# Patient Record
Sex: Male | Born: 1945
Health system: Southern US, Community
[De-identification: ages and names within clinical notes are randomized; demographics above are authoritative.]

## PROBLEM LIST (undated history)

## (undated) DIAGNOSIS — I219 Acute myocardial infarction, unspecified: Secondary | ICD-10-CM

## (undated) DIAGNOSIS — Z972 Presence of dental prosthetic device (complete) (partial): Secondary | ICD-10-CM

## (undated) DIAGNOSIS — I2510 Atherosclerotic heart disease of native coronary artery without angina pectoris: Secondary | ICD-10-CM

## (undated) DIAGNOSIS — M545 Low back pain, unspecified: Secondary | ICD-10-CM

## (undated) DIAGNOSIS — K5190 Ulcerative colitis, unspecified, without complications: Secondary | ICD-10-CM

## (undated) DIAGNOSIS — Z789 Other specified health status: Secondary | ICD-10-CM

## (undated) DIAGNOSIS — M549 Dorsalgia, unspecified: Secondary | ICD-10-CM

## (undated) DIAGNOSIS — K219 Gastro-esophageal reflux disease without esophagitis: Secondary | ICD-10-CM

## (undated) DIAGNOSIS — I10 Essential (primary) hypertension: Secondary | ICD-10-CM

## (undated) DIAGNOSIS — Z87442 Personal history of urinary calculi: Secondary | ICD-10-CM

## (undated) DIAGNOSIS — M1990 Unspecified osteoarthritis, unspecified site: Secondary | ICD-10-CM

## (undated) DIAGNOSIS — Z973 Presence of spectacles and contact lenses: Secondary | ICD-10-CM

## (undated) DIAGNOSIS — G8929 Other chronic pain: Secondary | ICD-10-CM

## (undated) HISTORY — PX: HERNIA REPAIR: SHX51

## (undated) HISTORY — PX: CORONARY ANGIOPLASTY: SHX604

## (undated) HISTORY — PX: COLON SURGERY: SHX602

## (undated) HISTORY — PX: BACK SURGERY: SHX140

## (undated) HISTORY — PX: APPENDECTOMY: SHX54

## (undated) HISTORY — PX: NOSE SURGERY: SHX723

---

## 2007-05-02 ENCOUNTER — Emergency Department (HOSPITAL_COMMUNITY): Admission: EM | Admit: 2007-05-02 | Discharge: 2007-05-02 | Payer: Self-pay | Admitting: Emergency Medicine

## 2007-08-29 ENCOUNTER — Ambulatory Visit (HOSPITAL_COMMUNITY): Admission: RE | Admit: 2007-08-29 | Discharge: 2007-08-30 | Payer: Self-pay | Admitting: Neurological Surgery

## 2007-08-29 IMAGING — CR DG LUMBAR SPINE 2-3V
1 series · 1 of 1 positions shown · non-contrast
Comparison: None

CLINICAL DATA: Right L3-4 microdiskectomy, herniated nucleus pulposus.
 LUMBAR SPINE INTRAOPERATIVE ? 2 VIEW:

[view not recorded]
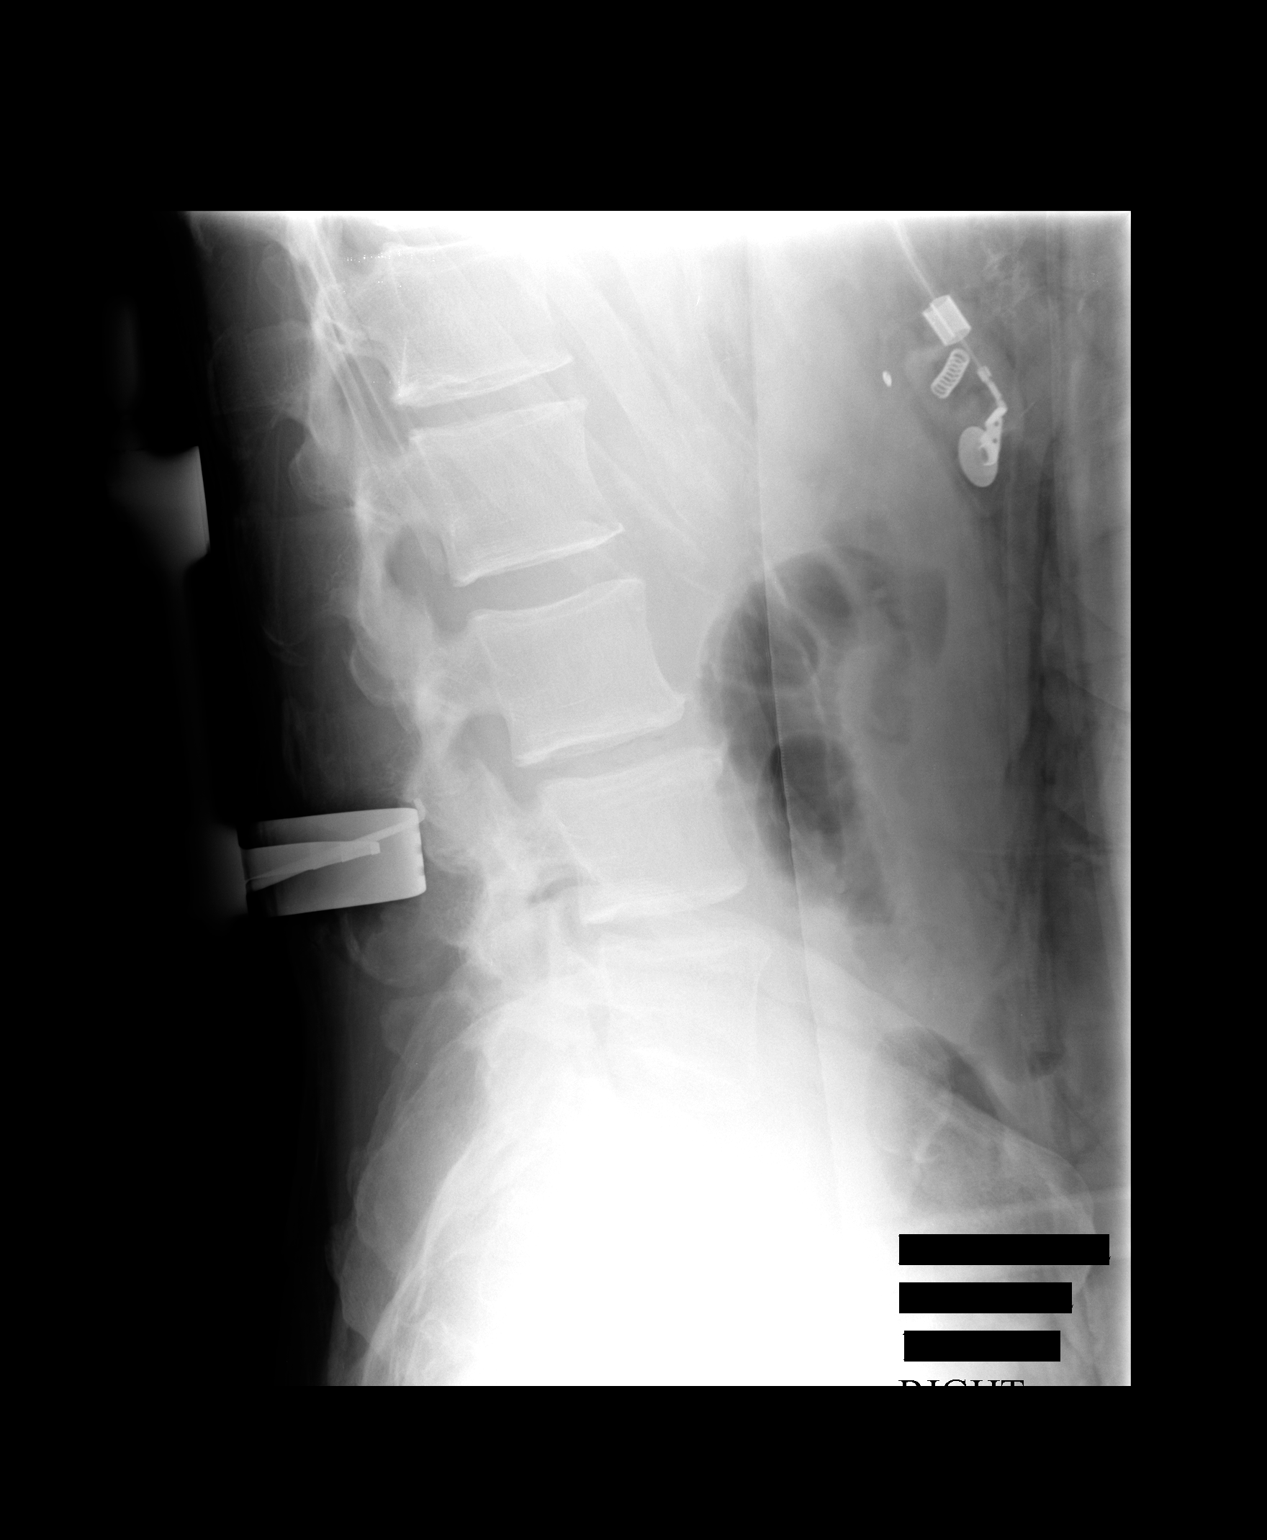

[1 of 1 positions shown; findings below may reference images not displayed]

FINDINGS: Initial image obtained at [DATE] demonstrates a radiopaque marker posterior to the superior end plate of L4.  4 mm anterolisthesis of L4 on L3 is noted.  Atherosclerotic aortic calcifications are identified.
 A 2nd image obtained intraoperatively at [DATE] demonstrates radiopaque marker posterior to the L3-L4 interspace.
IMPRESSION: Intraoperative radiographs as above.

## 2010-10-24 NOTE — Op Note (Signed)
NAME:  Eric Giles, CURE NO.:  [id number]   MEDICAL RECORD NO.:  [id number]          PATIENT TYPE:  OIB   LOCATION:  3528                         FACILITY:  [facility]   PHYSICIAN:  Tia Alert, MD     DATE OF BIRTH:  05-Apr-1946   DATE OF PROCEDURE:  08/29/2007  DATE OF DISCHARGE:                               OPERATIVE REPORT   PREOPERATIVE DIAGNOSIS:  Lumbar disk herniation L3-4 on the right with  right L4 radiculopathy.   POSTOPERATIVE DIAGNOSIS:  Lumbar disk herniation L3-4 on the right with  right L4 radiculopathy.   PROCEDURE:  Lumbar hemilaminectomy, medial facetectomy, foraminotomy at  L3-4 on the right followed by microdiskectomy L3-4 on the right  utilizing microscopic dissection.   SURGEON:  Tia Alert, MD   ANESTHESIA:  General endotracheal.   COMPLICATIONS:  None apparent.   INDICATIONS FOR PROCEDURE:  [name] is a 65 year old gentleman who  was referred with severe right leg pain.  He had an MRI which showed a  large disk herniation L3-4 on the right with a large free fragment  extending down to the pedicle level with severe compression of the right  L4 nerve root.  He was in agony and wished for early surgical  intervention.  I recommended a microdiskectomy at L3-4 on the right  side.  The understood the risks, benefits, expected outcome and wished  to proceed.   DESCRIPTION OF PROCEDURE:  The patient was taken to the operating room  and after the induction of adequate generalized endotracheal anesthesia  he was rolled into the prone position on the Wilson frame.  All pressure  points were padded.  His lumbar region was prepped with DuraPrep and  then draped in the usual sterile fashion.  Four mL of local anesthesia  was injected and a small dorsal midline incision was made and carried  down to the lumbosacral fascia.  The fascia was opened on the patient's  right side and taken down in a subperiosteal fashion to expose L3-4 on  the  right.  Intraoperative x-ray confirmed my level at L3-4 and then I  used the high speed drill and the Kerrison punches to perform a  hemilaminectomy, medial facetectomy and foraminotomy at L3-4 on the  right side.  There was a large piece of sessile bone adjacent to the  foramen on the right side which was removed with a nerve hook and a  pituitary rongeurs.  I then widened the foraminotomy decompressing down  distal to the pedicle level at L4 because I knew the disk had come out  of the disk space and gone caudally.  I decompressed the lateral recess.  I was then able to coagulate the epidural venous vasculature and  utilizing microscopic dissection got a nerve hook into the large free  fragment and removed several fragments of disk from underneath the L4  nerve root.  I then followed this up superiorly until I found where it  was exiting from an annular tear at the inferior part of the annulus at  L3-4.  I incised the disk  space and performed a thorough intradiskal  diskectomy also.  Once this was done I palpated with coronary dilator  into the foramen.  I felt no more fragments of disk under the nerve or  compressing the nerve.  While I was feeling distal to the pedicle I  palpated into the foramen and into the midline and I felt no more  compression of the nerve root.  I irrigated with saline solution  containing bacitracin, dried all bleeding points with bipolar cautery or  Surgifoam.  I then lined the dura with Surgifoam after inspecting the  nerve root once again, finding no more fragments of disk.  I then lined  the dura with Gelfoam and then closed the fascia with zero Vicryl,  closed the subcutaneous and subcuticular tissue with 2-0 and 3-0 Vicryl  and closed the skin with Benzoin and Steri-Strips.  The  drapes were removed.  A sterile dressing was applied.  The patient was  awakened from general anesthesia and transferred to the recovery room in  stable condition.  At the end of  procedure all sponge, needle and  instrument counts were correct.      Tia Alert, MD  Electronically Signed     DSJ/MEDQ  D:  08/29/2007  T:  08/29/2007  Job:  [id number]

## 2011-03-05 LAB — CBC
HCT: 43.7
RBC: 4.82
WBC: 10.2

## 2011-03-05 LAB — BASIC METABOLIC PANEL
BUN: 21
CO2: 29
Calcium: 9.2
Chloride: 104
Glucose, Bld: 97
Sodium: 140

## 2011-03-05 LAB — DIFFERENTIAL
Eosinophils Absolute: 0.5
Lymphocytes Relative: 18
Lymphs Abs: 1.9
Monocytes Absolute: 0.8

## 2011-03-05 LAB — PROTIME-INR: Prothrombin Time: 13.1

## 2011-03-20 LAB — DIFFERENTIAL
Basophils Absolute: 0
Eosinophils Absolute: 0.3
Eosinophils Relative: 2
Lymphs Abs: 1.6
Monocytes Absolute: 1.1 — ABNORMAL HIGH
Neutro Abs: 7.5

## 2011-03-20 LAB — COMPREHENSIVE METABOLIC PANEL
AST: 29
Calcium: 9.2
Potassium: 4.3
Sodium: 136
Total Protein: 6.5

## 2011-03-20 LAB — CBC
HCT: 42.8
MCHC: 33.9
MCV: 89.9
Platelets: 200
RBC: 4.76
RDW: 12.9
WBC: 10.4

## 2011-03-20 LAB — POCT CARDIAC MARKERS
CKMB, poc: 1.8
Myoglobin, poc: 107
Operator id: 270111
Troponin i, poc: <0.05

## 2012-11-07 ENCOUNTER — Encounter (INDEPENDENT_AMBULATORY_CARE_PROVIDER_SITE_OTHER): Payer: Self-pay | Admitting: Surgery

## 2012-11-07 ENCOUNTER — Ambulatory Visit (INDEPENDENT_AMBULATORY_CARE_PROVIDER_SITE_OTHER): Payer: Medicare Other | Admitting: Surgery

## 2012-11-07 VITALS — BP 120/74 | HR 70 | Temp 98.0°F | Resp 14 | Ht 70.0 in | Wt 176.6 lb

## 2012-11-07 DIAGNOSIS — K430 Incisional hernia with obstruction, without gangrene: Secondary | ICD-10-CM

## 2012-11-07 NOTE — Progress Notes (Signed)
Patient ID: [name], male   DOB: 11/21/1945, 67 y.o.   MRN: [id number]  Chief Complaint  Patient presents with  . Hernia    HPI Eric Giles is a 67 y.o. male.  PCP - Eric Giles.  Self-referred for ventral hernia HPI This is a 67-year-old male who is status post total colectomy for ulcerative colitis in 1998. This was performed at [facility]. He had a temporary ileostomy and then had subsequent ileostomy reversal. Over the last several years he had developed some swelling at his umbilicus. This never really bothered him but it has enlarged slightly. Over the last several months the patient has noticed progressive swelling and a visible bulge above his umbilicus.He denies any obstructive symptoms. Appetite and bowel movements are normal for him.  He has frequent soft bowel movements several times a day.  History reviewed. No pertinent past medical history.  Past Surgical History  Procedure Laterality Date  . Colon surgery    . Back surgery      History reviewed. No pertinent family history.  Social History History  Substance Use Topics  . Smoking status: Former Smoker  . Smokeless tobacco: Not on file  . Alcohol Use: No    No Known Allergies  Current Outpatient Prescriptions  Medication Sig Dispense Refill  . ciprofloxacin (CIPRO) 500 MG tablet Take 500 mg by mouth daily.        No current facility-administered medications for this visit.    Review of Systems Review of Systems  Constitutional: Negative for fever, chills and unexpected weight change.  HENT: Negative for hearing loss, congestion, sore throat, trouble swallowing and voice change.   Eyes: Negative for visual disturbance.  Respiratory: Negative for cough and wheezing.   Cardiovascular: Negative for chest pain, palpitations and leg swelling.  Gastrointestinal: Positive for diarrhea and abdominal distention. Negative for nausea, vomiting, abdominal pain, constipation, blood in stool, anal bleeding and rectal  pain.  Genitourinary: Negative for hematuria and difficulty urinating.  Musculoskeletal: Negative for arthralgias.  Skin: Negative for rash and wound.  Neurological: Negative for seizures, syncope, weakness and headaches.  Hematological: Negative for adenopathy. Does not bruise/bleed easily.  Psychiatric/Behavioral: Negative for confusion.    Blood pressure 120/74, pulse 70, temperature 98 F (36.7 C), temperature source Temporal, resp. rate 14, height 5' 10" (1.778 m), weight 176 lb 9.6 oz (80.105 kg).  Physical Exam Physical Exam WDWN in NAD HEENT:  EOMI, sclera anicteric Neck:  No masses, no thyromegaly Lungs:  CTA bilaterally; normal respiratory effort CV:  Regular rate and rhythm; no murmurs Abd:  +bowel sounds, soft, non-tender, healed midline incision Visible reducible bulge above and to the right of the umbilicus; partially reducible Protruding umbilicus - soft, partially reducible Ext:  Well-perfused; no edema Skin:  Warm, dry; no sign of jaundice  Data Reviewed none  Assessment    Two ventral hernia defects - both feel fairly small and partially reducible     Plan    Laparoscopic ventral hernia repair with mesh.  The surgical procedure has been discussed with the patient.  Potential risks, benefits, alternative treatments, and expected outcomes have been explained.  All of the patient's questions at this time have been answered.  The likelihood of reaching the patient's treatment goal is good.  The patient understand the proposed surgical procedure and wishes to proceed.         Beatriz Settles K. 11/07/2012, 12:17 PM    

## 2012-11-18 ENCOUNTER — Encounter (HOSPITAL_COMMUNITY): Payer: Self-pay | Admitting: Pharmacy Technician

## 2012-11-21 NOTE — Patient Instructions (Addendum)
20 Eric Giles  11/21/2012   Your procedure is scheduled on: 12/01/12  Report to [facility] at 0515 AM.  Call this number if you have problems the morning of surgery [phone]: [phone]   Remember:   Do not eat food or drink liquids After Midnight.     Take these medicines the morning of surgery with A SIP OF WATER: cipro    Do not wear jewelry, make-up or nail polish.  Do not wear lotions, powders, or perfumes. You may wear deodorant.  Do not shave 48 hours prior to surgery. Men may shave face and neck.  Do not bring valuables to the hospital.  Contacts, dentures or bridgework may not be worn into surgery.  Leave suitcase in the car. After surgery it may be brought to your room.  For patients admitted to the hospital, checkout time is 11:00 AM the day of discharge.   Please read over the following fact sheets that you were given: MRSA Information.  Birdie Sons, RN  pre op nurse call if needed [phone]    FAILURE TO FOLLOW THESE INSTRUCTIONS MAY RESULT IN CANCELLATION OF YOUR SURGERY   Patient Signature: ___________________________________________

## 2012-11-24 ENCOUNTER — Encounter (HOSPITAL_COMMUNITY)
Admission: RE | Admit: 2012-11-24 | Discharge: 2012-11-24 | Disposition: A | Discharge status: Home / Self Care | Payer: Medicare Other | Source: Ambulatory Visit | Attending: Surgery | Admitting: Surgery

## 2012-11-24 ENCOUNTER — Encounter (HOSPITAL_COMMUNITY): Payer: Self-pay

## 2012-11-24 DIAGNOSIS — K432 Incisional hernia without obstruction or gangrene: Secondary | ICD-10-CM | POA: Insufficient documentation

## 2012-11-24 DIAGNOSIS — Z01812 Encounter for preprocedural laboratory examination: Secondary | ICD-10-CM | POA: Insufficient documentation

## 2012-11-24 HISTORY — DX: Other specified health status: Z78.9

## 2012-11-24 HISTORY — DX: Dorsalgia, unspecified: M54.9

## 2012-11-24 HISTORY — DX: Ulcerative colitis, unspecified, without complications: K51.90

## 2012-11-24 HISTORY — DX: Other chronic pain: G89.29

## 2012-11-24 LAB — BASIC METABOLIC PANEL
CO2: 27 mEq/L (ref 19–32)
Calcium: 10 mg/dL (ref 8.4–10.5)
Chloride: 104 mEq/L (ref 96–112)
Creatinine, Ser: 1.34 mg/dL (ref 0.50–1.35)
Potassium: 4.5 mEq/L (ref 3.5–5.1)
Sodium: 139 mEq/L (ref 135–145)

## 2012-11-24 LAB — CBC
MCH: 29.8 pg (ref 26.0–34.0)
MCV: 86.9 fL (ref 78.0–100.0)
Platelets: 232 10*3/uL (ref 150–400)
RDW: 12.4 % (ref 11.5–15.5)
WBC: 9.7 10*3/uL (ref 4.0–10.5)

## 2012-11-24 LAB — SURGICAL PCR SCREEN: MRSA, PCR: NEGATIVE

## 2012-12-01 ENCOUNTER — Ambulatory Visit (HOSPITAL_COMMUNITY): Payer: Medicare Other | Admitting: Anesthesiology

## 2012-12-01 ENCOUNTER — Encounter (HOSPITAL_COMMUNITY)
Admission: RE | Disposition: A | Discharge status: Home / Self Care | Payer: Self-pay | Source: Ambulatory Visit | Attending: Surgery

## 2012-12-01 ENCOUNTER — Encounter (HOSPITAL_COMMUNITY): Payer: Self-pay | Admitting: Anesthesiology

## 2012-12-01 ENCOUNTER — Observation Stay (HOSPITAL_COMMUNITY)
Admission: RE | Admit: 2012-12-01 | Discharge: 2012-12-03 | Disposition: A | Discharge status: Home / Self Care | Payer: Medicare Other | Source: Ambulatory Visit | Attending: Surgery | Admitting: Surgery

## 2012-12-01 ENCOUNTER — Encounter (HOSPITAL_COMMUNITY): Payer: Self-pay | Admitting: *Deleted

## 2012-12-01 DIAGNOSIS — K432 Incisional hernia without obstruction or gangrene: Principal | ICD-10-CM | POA: Insufficient documentation

## 2012-12-01 DIAGNOSIS — K439 Ventral hernia without obstruction or gangrene: Secondary | ICD-10-CM

## 2012-12-01 DIAGNOSIS — K430 Incisional hernia with obstruction, without gangrene: Secondary | ICD-10-CM

## 2012-12-01 HISTORY — PX: VENTRAL HERNIA REPAIR: SHX424

## 2012-12-01 HISTORY — PX: INSERTION OF MESH: SHX5868

## 2012-12-01 SURGERY — REPAIR, HERNIA, VENTRAL, LAPAROSCOPIC
Anesthesia: General | Wound class: Clean

## 2012-12-01 MED ORDER — LABETALOL HCL 5 MG/ML IV SOLN
INTRAVENOUS | Status: DC | PRN
  Administered 2012-12-01: 5 mg via INTRAVENOUS

## 2012-12-01 MED ORDER — HYDROMORPHONE HCL PF 1 MG/ML IJ SOLN
INTRAMUSCULAR | Status: AC
  Filled 2012-12-01: qty 1

## 2012-12-01 MED ORDER — PROMETHAZINE HCL 25 MG/ML IJ SOLN: 6.2500 mg | INTRAMUSCULAR | Status: DC | PRN

## 2012-12-01 MED ORDER — HYDRALAZINE HCL 20 MG/ML IJ SOLN
INTRAMUSCULAR | Status: DC | PRN
  Administered 2012-12-01: 10 mg via INTRAVENOUS

## 2012-12-01 MED ORDER — GLYCOPYRROLATE 0.2 MG/ML IJ SOLN
INTRAMUSCULAR | Status: DC | PRN
  Administered 2012-12-01: 0.4 mg via INTRAVENOUS

## 2012-12-01 MED ORDER — CEFAZOLIN SODIUM-DEXTROSE 2-3 GM-% IV SOLR
2.0000 g | INTRAVENOUS | Status: AC
  Administered 2012-12-01: 2 g via INTRAVENOUS

## 2012-12-01 MED ORDER — IBUPROFEN 600 MG PO TABS
600.0000 mg | ORAL_TABLET | Freq: Four times a day (QID) | ORAL | Status: DC | PRN
  Filled 2012-12-01: qty 1

## 2012-12-01 MED ORDER — NEOSTIGMINE METHYLSULFATE 1 MG/ML IJ SOLN
INTRAMUSCULAR | Status: DC | PRN
  Administered 2012-12-01: 3 mg via INTRAVENOUS

## 2012-12-01 MED ORDER — ENOXAPARIN SODIUM 40 MG/0.4ML ~~LOC~~ SOLN
40.0000 mg | SUBCUTANEOUS | Status: DC
  Administered 2012-12-02 – 2012-12-03 (×2): 40 mg via SUBCUTANEOUS
  Filled 2012-12-01 (×3): qty 0.4

## 2012-12-01 MED ORDER — HYDROMORPHONE HCL PF 1 MG/ML IJ SOLN
0.2500 mg | INTRAMUSCULAR | Status: DC | PRN
  Administered 2012-12-01: 0.5 mg via INTRAVENOUS
  Administered 2012-12-01 (×4): 0.25 mg via INTRAVENOUS
  Administered 2012-12-01: 0.5 mg via INTRAVENOUS

## 2012-12-01 MED ORDER — BUPIVACAINE-EPINEPHRINE 0.25% -1:200000 IJ SOLN
INTRAMUSCULAR | Status: AC
  Filled 2012-12-01: qty 1

## 2012-12-01 MED ORDER — FENTANYL CITRATE 0.05 MG/ML IJ SOLN
INTRAMUSCULAR | Status: DC | PRN
  Administered 2012-12-01 (×2): 50 ug via INTRAVENOUS
  Administered 2012-12-01: 100 ug via INTRAVENOUS
  Administered 2012-12-01: 50 ug via INTRAVENOUS
  Administered 2012-12-01: 100 ug via INTRAVENOUS

## 2012-12-01 MED ORDER — CEFAZOLIN SODIUM 1-5 GM-% IV SOLN
1.0000 g | Freq: Four times a day (QID) | INTRAVENOUS | Status: AC
  Administered 2012-12-01 – 2012-12-02 (×3): 1 g via INTRAVENOUS
  Filled 2012-12-01 (×4): qty 50

## 2012-12-01 MED ORDER — PROPOFOL 10 MG/ML IV BOLUS
INTRAVENOUS | Status: DC | PRN
  Administered 2012-12-01: 200 mg via INTRAVENOUS

## 2012-12-01 MED ORDER — KETOROLAC TROMETHAMINE 30 MG/ML IJ SOLN
INTRAMUSCULAR | Status: AC
  Filled 2012-12-01: qty 1

## 2012-12-01 MED ORDER — FENTANYL CITRATE 0.05 MG/ML IJ SOLN
INTRAMUSCULAR | Status: AC
  Filled 2012-12-01: qty 2

## 2012-12-01 MED ORDER — HYDROMORPHONE HCL PF 1 MG/ML IJ SOLN
INTRAMUSCULAR | Status: DC | PRN
  Administered 2012-12-01: 1 mg via INTRAVENOUS
  Administered 2012-12-01 (×2): 0.5 mg via INTRAVENOUS

## 2012-12-01 MED ORDER — ONDANSETRON HCL 4 MG/2ML IJ SOLN
INTRAMUSCULAR | Status: DC | PRN
  Administered 2012-12-01: 4 mg via INTRAVENOUS

## 2012-12-01 MED ORDER — MIDAZOLAM HCL 2 MG/2ML IJ SOLN
INTRAMUSCULAR | Status: AC
  Filled 2012-12-01: qty 2

## 2012-12-01 MED ORDER — SODIUM CHLORIDE 0.9 % IV SOLN
INTRAVENOUS | Status: DC
  Administered 2012-12-01 – 2012-12-02 (×2): via INTRAVENOUS

## 2012-12-01 MED ORDER — OXYCODONE-ACETAMINOPHEN 5-325 MG PO TABS
1.0000 | ORAL_TABLET | ORAL | Status: DC | PRN
  Administered 2012-12-01 – 2012-12-02 (×4): 1 via ORAL
  Filled 2012-12-01 (×2): qty 1
  Filled 2012-12-01: qty 2
  Filled 2012-12-01: qty 1

## 2012-12-01 MED ORDER — CEFAZOLIN SODIUM-DEXTROSE 2-3 GM-% IV SOLR
INTRAVENOUS | Status: AC
  Filled 2012-12-01: qty 50

## 2012-12-01 MED ORDER — ONDANSETRON HCL 4 MG PO TABS: 4.0000 mg | ORAL_TABLET | Freq: Four times a day (QID) | ORAL | Status: DC | PRN

## 2012-12-01 MED ORDER — CIPROFLOXACIN HCL 500 MG PO TABS
500.0000 mg | ORAL_TABLET | Freq: Every morning | ORAL | Status: DC
  Administered 2012-12-02 – 2012-12-03 (×2): 500 mg via ORAL
  Filled 2012-12-01 (×2): qty 1

## 2012-12-01 MED ORDER — MIDAZOLAM HCL 2 MG/2ML IJ SOLN
0.5000 mg | Freq: Once | INTRAMUSCULAR | Status: AC | PRN
  Administered 2012-12-01: 1 mg via INTRAVENOUS

## 2012-12-01 MED ORDER — MIDAZOLAM HCL 5 MG/5ML IJ SOLN
INTRAMUSCULAR | Status: DC | PRN
  Administered 2012-12-01: 2 mg via INTRAVENOUS

## 2012-12-01 MED ORDER — ROCURONIUM BROMIDE 100 MG/10ML IV SOLN
INTRAVENOUS | Status: DC | PRN
  Administered 2012-12-01: 10 mg via INTRAVENOUS
  Administered 2012-12-01: 50 mg via INTRAVENOUS

## 2012-12-01 MED ORDER — ONDANSETRON HCL 4 MG/2ML IJ SOLN: 4.0000 mg | Freq: Four times a day (QID) | INTRAMUSCULAR | Status: DC | PRN

## 2012-12-01 MED ORDER — LIDOCAINE HCL (CARDIAC) 20 MG/ML IV SOLN
INTRAVENOUS | Status: DC | PRN
  Administered 2012-12-01: 60 mg via INTRAVENOUS

## 2012-12-01 MED ORDER — HYDROMORPHONE HCL PF 1 MG/ML IJ SOLN
1.0000 mg | INTRAMUSCULAR | Status: DC | PRN
  Administered 2012-12-01 – 2012-12-02 (×7): 1 mg via INTRAVENOUS
  Filled 2012-12-01 (×7): qty 1

## 2012-12-01 MED ORDER — ACETAMINOPHEN 10 MG/ML IV SOLN
INTRAVENOUS | Status: AC
  Filled 2012-12-01: qty 100

## 2012-12-01 MED ORDER — BUPIVACAINE-EPINEPHRINE 0.25% -1:200000 IJ SOLN
INTRAMUSCULAR | Status: DC | PRN
  Administered 2012-12-01: 20 mL

## 2012-12-01 MED ORDER — FENTANYL CITRATE 0.05 MG/ML IJ SOLN
25.0000 ug | INTRAMUSCULAR | Status: DC | PRN
  Administered 2012-12-01 (×2): 50 ug via INTRAVENOUS

## 2012-12-01 MED ORDER — ACETAMINOPHEN 10 MG/ML IV SOLN
INTRAVENOUS | Status: DC | PRN
  Administered 2012-12-01: 1000 mg via INTRAVENOUS

## 2012-12-01 MED ORDER — KETOROLAC TROMETHAMINE 30 MG/ML IJ SOLN
15.0000 mg | Freq: Once | INTRAMUSCULAR | Status: AC | PRN
  Administered 2012-12-01: 30 mg via INTRAVENOUS

## 2012-12-01 MED ORDER — LACTATED RINGERS IV SOLN
INTRAVENOUS | Status: DC | PRN
  Administered 2012-12-01 (×2): via INTRAVENOUS

## 2012-12-01 SURGICAL SUPPLY — 60 items
APPLIER CLIP 5 13 M/L LIGAMAX5 (MISCELLANEOUS)
BENZOIN TINCTURE PRP APPL 2/3 (GAUZE/BANDAGES/DRESSINGS) IMPLANT
BINDER ABD UNIV 12 45-62 (WOUND CARE) ×1 IMPLANT
BINDER ABDOMINAL 46IN 62IN (WOUND CARE) ×2
CANISTER SUCTION 2500CC (MISCELLANEOUS) ×2 IMPLANT
CANNULA ENDOPATH XCEL 11M (ENDOMECHANICALS) IMPLANT
CHLORAPREP W/TINT 10.5 ML (MISCELLANEOUS) ×2 IMPLANT
CLIP APPLIE 5 13 M/L LIGAMAX5 (MISCELLANEOUS) IMPLANT
CLOTH BEACON ORANGE TIMEOUT ST (SAFETY) ×2 IMPLANT
COVER SURGICAL LIGHT HANDLE (MISCELLANEOUS) ×2 IMPLANT
DECANTER SPIKE VIAL GLASS SM (MISCELLANEOUS) ×2 IMPLANT
DERMABOND ADVANCED (GAUZE/BANDAGES/DRESSINGS) ×1
DERMABOND ADVANCED .7 DNX12 (GAUZE/BANDAGES/DRESSINGS) ×1 IMPLANT
DEVICE SECURE STRAP 25 ABSORB (INSTRUMENTS) ×4 IMPLANT
DEVICE TROCAR PUNCTURE CLOSURE (ENDOMECHANICALS) ×2 IMPLANT
DRAIN CHANNEL RND F F (WOUND CARE) IMPLANT
DRAPE LAPAROSCOPIC ABDOMINAL (DRAPES) ×2 IMPLANT
DRAPE UTILITY XL STRL (DRAPES) ×2 IMPLANT
ELECT REM PT RETURN 9FT ADLT (ELECTROSURGICAL) ×2
ELECTRODE REM PT RTRN 9FT ADLT (ELECTROSURGICAL) ×1 IMPLANT
EVACUATOR SILICONE 100CC (DRAIN) IMPLANT
FILTER SMOKE EVAC LAPAROSHD (FILTER) ×2 IMPLANT
GLOVE BIO SURGEON STRL SZ7 (GLOVE) ×2 IMPLANT
GLOVE BIOGEL PI IND STRL 7.0 (GLOVE) ×1 IMPLANT
GLOVE BIOGEL PI IND STRL 7.5 (GLOVE) ×1 IMPLANT
GLOVE BIOGEL PI INDICATOR 7.0 (GLOVE) ×1
GLOVE BIOGEL PI INDICATOR 7.5 (GLOVE) ×1
GOWN STRL NON-REIN LRG LVL3 (GOWN DISPOSABLE) ×4 IMPLANT
GOWN STRL REIN XL XLG (GOWN DISPOSABLE) ×2 IMPLANT
IV LACTATED RINGERS 1000ML (IV SOLUTION) IMPLANT
KIT BASIN OR (CUSTOM PROCEDURE TRAY) ×2 IMPLANT
MARKER SKIN DUAL TIP RULER LAB (MISCELLANEOUS) ×2 IMPLANT
MESH VENTRALIGHT ST 6X8 (Mesh Specialty) ×1 IMPLANT
MESH VENTRLGHT ELLIPSE 8X6XMFL (Mesh Specialty) ×1 IMPLANT
NEEDLE SPNL 22GX3.5 QUINCKE BK (NEEDLE) ×2 IMPLANT
NS IRRIG 1000ML POUR BTL (IV SOLUTION) ×2 IMPLANT
PENCIL BUTTON HOLSTER BLD 10FT (ELECTRODE) IMPLANT
SCALPEL HARMONIC ACE (MISCELLANEOUS) ×2 IMPLANT
SCISSORS LAP 5X35 DISP (ENDOMECHANICALS) IMPLANT
SET IRRIG TUBING LAPAROSCOPIC (IRRIGATION / IRRIGATOR) IMPLANT
SOLUTION ANTI FOG 6CC (MISCELLANEOUS) ×2 IMPLANT
STAPLER VISISTAT 35W (STAPLE) IMPLANT
STRIP CLOSURE SKIN 1/2X4 (GAUZE/BANDAGES/DRESSINGS) IMPLANT
SUT MNCRL AB 4-0 PS2 18 (SUTURE) ×2 IMPLANT
SUT NOVA NAB DX-16 0-1 5-0 T12 (SUTURE) IMPLANT
SUT NOVA NAB GS-21 0 18 T12 DT (SUTURE) ×4 IMPLANT
SUT PROLENE 0 CT 1 CR/8 (SUTURE) IMPLANT
SUT VICRYL 0 TIES 12 18 (SUTURE) IMPLANT
TACKER 5MM HERNIA 3.5CML NAB (ENDOMECHANICALS) IMPLANT
TOWEL OR 17X26 10 PK STRL BLUE (TOWEL DISPOSABLE) ×2 IMPLANT
TRAY FOLEY CATH 14FRSI W/METER (CATHETERS) ×2 IMPLANT
TRAY LAP CHOLE (CUSTOM PROCEDURE TRAY) ×2 IMPLANT
TROCAR BLADELESS OPT 5 75 (ENDOMECHANICALS) ×6 IMPLANT
TROCAR THREADED VISUAL INSUFF (TROCAR) IMPLANT
TROCAR XCEL 12X100 BLDLESS (ENDOMECHANICALS) IMPLANT
TROCAR XCEL NON-BLD 11X100MML (ENDOMECHANICALS) ×2 IMPLANT
TROCAR Z-THREAD FIOS 12X100MM (TROCAR) IMPLANT
TUBING FILTER THERMOFLATOR (ELECTROSURGICAL) ×2 IMPLANT
TUBING INSUFFLATION 10FT LAP (TUBING) ×2 IMPLANT
WARMER LAPAROSCOPE (MISCELLANEOUS) ×2 IMPLANT

## 2012-12-01 NOTE — Interval H&P Note (Signed)
History and Physical Interval Note:  12/01/2012 7:13 AM  Eric Giles  has presented today for surgery, with the diagnosis of ventral incisional hernias  The various methods of treatment have been discussed with the patient and family. After consideration of risks, benefits and other options for treatment, the patient has consented to  Procedure(s): LAPAROSCOPIC VENTRAL HERNIA (N/A) INSERTION OF MESH (N/A) as a surgical intervention .  The patient's history has been reviewed, patient examined, no change in status, stable for surgery.  I have reviewed the patient's chart and labs.  Questions were answered to the patient's satisfaction.     Sophiagrace Benbrook K.

## 2012-12-01 NOTE — Anesthesia Preprocedure Evaluation (Addendum)

## 2012-12-01 NOTE — H&P (View-Only) (Signed)
Patient ID: Eric Giles, male   DOB: February 19, 1946, 67 y.o.   MRN: [id number]  Chief Complaint  Patient presents with  . Hernia    HPI Eric Giles is a 67 y.o. male.  PCP - Lonie Peak.  Self-referred for ventral hernia HPI This is a 67 year old male who is status post total colectomy for ulcerative colitis in 1998. This was performed at [facility]. He had a temporary ileostomy and then had subsequent ileostomy reversal. Over the last several years he had developed some swelling at his umbilicus. This never really bothered him but it has enlarged slightly. Over the last several months the patient has noticed progressive swelling and a visible bulge above his umbilicus.He denies any obstructive symptoms. Appetite and bowel movements are normal for him.  He has frequent soft bowel movements several times a day.  History reviewed. No pertinent past medical history.  Past Surgical History  Procedure Laterality Date  . Colon surgery    . Back surgery      History reviewed. No pertinent family history.  Social History History  Substance Use Topics  . Smoking status: Former Games developer  . Smokeless tobacco: Not on file  . Alcohol Use: No    No Known Allergies  Current Outpatient Prescriptions  Medication Sig Dispense Refill  . ciprofloxacin (CIPRO) 500 MG tablet Take 500 mg by mouth daily.        No current facility-administered medications for this visit.    Review of Systems Review of Systems  Constitutional: Negative for fever, chills and unexpected weight change.  HENT: Negative for hearing loss, congestion, sore throat, trouble swallowing and voice change.   Eyes: Negative for visual disturbance.  Respiratory: Negative for cough and wheezing.   Cardiovascular: Negative for chest pain, palpitations and leg swelling.  Gastrointestinal: Positive for diarrhea and abdominal distention. Negative for nausea, vomiting, abdominal pain, constipation, blood in stool, anal bleeding and rectal  pain.  Genitourinary: Negative for hematuria and difficulty urinating.  Musculoskeletal: Negative for arthralgias.  Skin: Negative for rash and wound.  Neurological: Negative for seizures, syncope, weakness and headaches.  Hematological: Negative for adenopathy. Does not bruise/bleed easily.  Psychiatric/Behavioral: Negative for confusion.    Blood pressure 120/74, pulse 70, temperature 98 F (36.7 C), temperature source Temporal, resp. rate 14, height 5\' 10"  (1.778 m), weight 176 lb 9.6 oz (80.105 kg).  Physical Exam Physical Exam WDWN in NAD HEENT:  EOMI, sclera anicteric Neck:  No masses, no thyromegaly Lungs:  CTA bilaterally; normal respiratory effort CV:  Regular rate and rhythm; no murmurs Abd:  +bowel sounds, soft, non-tender, healed midline incision Visible reducible bulge above and to the right of the umbilicus; partially reducible Protruding umbilicus - soft, partially reducible Ext:  Well-perfused; no edema Skin:  Warm, dry; no sign of jaundice  Data Reviewed none  Assessment    Two ventral hernia defects - both feel fairly small and partially reducible     Plan    Laparoscopic ventral hernia repair with mesh.  The surgical procedure has been discussed with the patient.  Potential risks, benefits, alternative treatments, and expected outcomes have been explained.  All of the patient's questions at this time have been answered.  The likelihood of reaching the patient's treatment goal is good.  The patient understand the proposed surgical procedure and wishes to proceed.         Brenda Cowher K. 11/07/2012, 12:17 PM

## 2012-12-01 NOTE — Transfer of Care (Signed)
Immediate Anesthesia Transfer of Care Note  Patient: Eric Giles  Procedure(s) Performed: Procedure(s): LAPAROSCOPIC VENTRAL HERNIA (N/A) INSERTION OF MESH (N/A)  Patient Location: PACU  Anesthesia Type:General  Level of Consciousness: awake, alert  and oriented  Airway & Oxygen Therapy: Patient Spontanous Breathing and Patient connected to face mask oxygen  Post-op Assessment: Report given to PACU RN, Post -op Vital signs reviewed and stable and Patient moving all extremities X 4  Post vital signs: Reviewed and stable  Complications: No apparent anesthesia complications

## 2012-12-01 NOTE — Op Note (Signed)
Laparoscopic Ventral Hernia Repair Procedure Note  Indications: Symptomatic ventral hernia  Pre-operative Diagnosis: ventral hernia  Post-operative Diagnosis: ventral hernia  Surgeon: Prem Coykendall K.   Assistants: none  Anesthesia: General endotracheal anesthesia  ASA Class: 2  Procedure Details  The patient was seen in the Holding Room. The risks, benefits, complications, treatment options, and expected outcomes were discussed with the patient. The possibilities of reaction to medication, pulmonary aspiration, perforation of viscus, bleeding, recurrent infection, the need for additional procedures, failure to diagnose a condition, and creating a complication requiring transfusion or operation were discussed with the patient. The patient concurred with the proposed plan, giving informed consent.  The site of surgery properly noted/marked. The patient was taken to the operating room, identified as Eric Giles and the procedure verified as laparoscopic ventral hernia repair with mesh. A Time Out was held and the above information confirmed.    The patient was placed supine.  After establishing general anesthesia, a Foley catheter was placed.  The abdomen was prepped with Chloraprep and draped in standard fashion.  A 5 mm Optiview was used the cannulate the peritoneal cavity in the left upper quadrant below the costal margin.  Pneumoperitoneum was obtained by insufflating CO2, maintaining a maximum pressure of 15 mmHg.  The 5 mm 30-degree laparoscopic was inserted.  There were significant omental adhesions to the anterior abdominal wall in and around the hernia defects.  An 11-mm port was placed in the left anterior axillary line at the level of the umbilicus.  Another 5-mm port was placed in the left lower quadrant.  Harmonic scalpel and gentle traction were used to dissect the omental adhesions away from the anterior abdominal wall.  We cleared the entire abdominal wall and were able to visualize  at least four fascial defects. There was a 3 cm defect in the epigastrium.  There was another 2 cm defect at the umbilicus.  There were two very small swiss-cheese defects just below the umbilicus.  We used a spinal needle to identify the extent of the hernia defects.  This covered an area of about 11 cms.  We selected a 20 x 15 cm Ventralight mesh.  We placed stay sutures of 0 Novofil around the edges of the mesh.  The mesh was then rolled up and inserted through the 11 mm port site.  The mesh was then unrolled.  The stay sutures were then pulled up through small stab incisions using the Endo-close device.  This deployed the mesh widely over the fascial defects.  The stay sutures were then tied down.  The left lateral stay suture broke, so we used the Endoclose device to pass another suture through the mesh to replace that stay suture.  The Secure Strap device was then used to tack down the edges of the mesh at 1 cm intervals circumferentially.  We placed a few tacks inside the outer ring of tacks.  We inspected for hemostasis.  The fascial defect at the 11 mm port site was closed with a 0 Vicryl using the Endoclose device.  Pneumoperitoneum was then released as we removed the remainder of the trocars.  The port sites were closed with 4-0 Monocryl.  All of the incisions and stay suture sites were then sealed with Dermabond.  An abdominal binder was placed around the patient's abdomen.  The patient was extubated and brought to the recovery room in stable condition.  All sponge, instrument, and needle counts were correct prior to closure and at the conclusion of  the case.   Findings: Two larger defects, two small swiss-cheese defects  Estimated Blood Loss:  Minimal         Complications:  None; patient tolerated the procedure well.         Disposition: PACU - hemodynamically stable.         Condition: stable  Wilmon Arms. Corliss Skains, MD, [facility]  General/ Trauma  Surgery  12/01/2012 9:32 AM

## 2012-12-01 NOTE — Anesthesia Postprocedure Evaluation (Signed)
  Anesthesia Post-op Note  Patient: Eric Giles  Procedure(s) Performed: Procedure(s) (LRB): LAPAROSCOPIC VENTRAL HERNIA (N/A) INSERTION OF MESH (N/A)  Patient Location: PACU  Anesthesia Type: General  Level of Consciousness: awake and alert   Airway and Oxygen Therapy: Patient Spontanous Breathing  Post-op Pain: mild  Post-op Assessment: Post-op Vital signs reviewed, Patient's Cardiovascular Status Stable, Respiratory Function Stable, Patent Airway and No signs of Nausea or vomiting  Last Vitals:  Filed Vitals:   12/01/12 0941  BP:   Pulse:   Temp: 34.9 C  Resp:     Post-op Vital Signs: stable   Complications: No apparent anesthesia complications

## 2012-12-01 NOTE — Preoperative (Signed)
Beta Blockers   Reason not to administer Beta Blockers:Not Applicable 

## 2012-12-02 ENCOUNTER — Encounter (HOSPITAL_COMMUNITY): Payer: Self-pay | Admitting: Surgery

## 2012-12-02 MED ORDER — KETOROLAC TROMETHAMINE 30 MG/ML IJ SOLN
30.0000 mg | Freq: Four times a day (QID) | INTRAMUSCULAR | Status: DC
  Administered 2012-12-02 – 2012-12-03 (×5): 30 mg via INTRAVENOUS
  Filled 2012-12-02 (×8): qty 1

## 2012-12-02 NOTE — Care Management Note (Signed)
    Page 1 of 1   12/02/2012     11:23:48 AM   CARE MANAGEMENT NOTE 12/02/2012  Patient:  Eric Giles, Eric Giles   Account Number:  [id number]  Date Initiated:  12/02/2012  Documentation initiated by:  Lorenda Ishihara  Subjective/Objective Assessment:   67 yo male admitted s/p ventral hernia repair. PTA lived at home with spouse.     Action/Plan:   Home when stable   Anticipated DC Date:  12/03/2012   Anticipated DC Plan:  HOME/SELF CARE      DC Planning Services  CM consult      Choice offered to / List presented to:             Status of service:  Completed, signed off Medicare Important Message given?   (If response is "NO", the following Medicare IM given date fields will be blank) Date Medicare IM given:   Date Additional Medicare IM given:    Discharge Disposition:  HOME/SELF CARE  Per UR Regulation:  Reviewed for med. necessity/level of care/duration of stay  If discussed at Long Length of Stay Meetings, dates discussed:    Comments:

## 2012-12-02 NOTE — Progress Notes (Addendum)
1 Day Post-Op  Subjective: Sore, still using some IV pain meds No nausea; tolerating regular diet  Objective: Vital signs in last 24 hours: Temp:  [94.9 F (34.9 C)-98.6 F (37 C)] 98.4 F (36.9 C) (06/24 4098) Pulse Rate:  [46-76] 46 (06/24 0614) Resp:  [12-17] 16 (06/24 0614) BP: (112-150)/(66-84) 126/68 mmHg (06/24 0614) SpO2:  [96 %-100 %] 99 % (06/24 0614) Weight:  [175 lb (79.379 kg)] 175 lb (79.379 kg) (06/23 1222) Last BM Date: 12/01/12  Intake/Output from previous day: 06/23 0701 - 06/24 0700 In: 3168.8 [P.O.:240; I.V.:2878.8; IV Piggyback:50] Out: 1645 [Urine:1625; Blood:20] Intake/Output this shift:    General appearance: alert, cooperative and no distress Resp: clear to auscultation bilaterally Cardio: regular rate and rhythm, S1, S2 normal, no murmur, click, rub or gallop GI: soft, occasional bowel sounds Incisions c/d/i  Lab Results:  No results found for this basename: WBC, HGB, HCT, PLT,  in the last 72 hours BMET No results found for this basename: NA, K, CL, CO2, GLUCOSE, BUN, CREATININE, CALCIUM,  in the last 72 hours PT/INR No results found for this basename: LABPROT, INR,  in the last 72 hours ABG No results found for this basename: PHART, PCO2, PO2, HCO3,  in the last 72 hours  Studies/Results: No results found.  Anti-infectives: Anti-infectives   Start     Dose/Rate Route Frequency Ordered Stop   12/02/12 1000  ciprofloxacin (CIPRO) tablet 500 mg     500 mg Oral  Every morning - 10a 12/01/12 1215     12/01/12 1230  ceFAZolin (ANCEF) IVPB 1 g/50 mL premix     1 g 100 mL/hr over 30 Minutes Intravenous Every 6 hours 12/01/12 1215 12/02/12 0452   12/01/12 0528  ceFAZolin (ANCEF) IVPB 2 g/50 mL premix     2 g 100 mL/hr over 30 Minutes Intravenous On call to O.R. 12/01/12 0528 12/01/12 0730      Assessment/Plan: s/p Procedure(s): LAPAROSCOPIC VENTRAL HERNIA (N/A) INSERTION OF MESH (N/A) Plan for discharge tomorrow Use mostly PO pain meds  today. Add Toradol Keep one more day for better pain control   LOS: 1 day    Eric Philipp K. 12/02/2012

## 2012-12-03 MED ORDER — OXYCODONE-ACETAMINOPHEN 5-325 MG PO TABS: 1.0000 | ORAL_TABLET | ORAL | Status: DC | PRN

## 2012-12-03 NOTE — Progress Notes (Signed)
Pt for d/c home today. Dermabond dressing remains to abdomen with no noted drainage. IV d/c'd. Tolerated diet and tolerated ambulation on hallway. Discharge instructions & RX given with verbalized understanding. Wife at bedside to assist with d/c.

## 2012-12-03 NOTE — Discharge Summary (Signed)
Physician Discharge Summary  Patient ID: Eric Giles MRN: [id number] DOB/AGE: April 02, 1946 67 y.o.  Admit date: 12/01/2012 Discharge date: 12/03/2012  Admission Diagnoses:  Ventral incisional hernias  Discharge Diagnoses: Same Active Problems:   * No active hospital problems. *   Discharged Condition: good  Hospital Course: Laparoscopic ventral hernia repair with mesh on 6/23.  The patient had some issues with pain control and required an additional day of hospitalization.  Doing much better with minimal pain med use on day of discharge.  + BM prior to discharge  Consults: None  Significant Diagnostic Studies: none  Treatments: surgery: lap ventral hernia repair with mesh  Discharge Exam: Blood pressure 142/76, pulse 57, temperature 98 F (36.7 C), temperature source Oral, resp. rate 18, height 5\' 10"  (1.778 m), weight 175 lb (79.379 kg), SpO2 95.00%. General appearance: alert, cooperative and no distress GI: soft, + BS Incisions c/d/i  Disposition:   Discharge Orders   Future Appointments Provider Department Dept Phone   12/15/2012 10:40 AM Eric Arms. Eric Bateson, MD [facility], [place] [phone]   Future Orders Complete By Expires     Call MD for:  persistant nausea and vomiting  As directed     Call MD for:  redness, tenderness, or signs of infection (pain, swelling, redness, odor or green/yellow discharge around incision site)  As directed     Call MD for:  severe uncontrolled pain  As directed     Call MD for:  temperature >100.4  As directed     Diet general  As directed     Driving Restrictions  As directed     Comments:      Do not drive while taking pain medications    Increase activity slowly  As directed     May shower / Bathe  As directed     May walk up steps  As directed     No dressing needed  As directed         Medication List    TAKE these medications       ciprofloxacin 500 MG tablet  Commonly known as:  CIPRO  Take 500 mg by mouth every  morning.     diphenhydrAMINE 25 mg capsule  Commonly known as:  BENADRYL  Take 25-50 mg by mouth daily as needed for allergies.     oxyCODONE-acetaminophen 5-325 MG per tablet  Commonly known as:  PERCOCET/ROXICET  Take 1-2 tablets by mouth every 4 (four) hours as needed.     psyllium 28 % packet  Commonly known as:  METAMUCIL SMOOTH TEXTURE  Take 1 packet by mouth 3 (three) times daily.     Zinc Oxide 40 % Pste  Place 1 application rectally daily as needed.           Follow-up Information   Follow up with Eric Elks K., MD. Schedule an appointment as soon as possible for a visit in 3 weeks.   Contact information:   [address] [employer] [address] [phone]       Signed: Wynona Luna. 12/03/2012, 6:10 AM

## 2012-12-15 ENCOUNTER — Ambulatory Visit (INDEPENDENT_AMBULATORY_CARE_PROVIDER_SITE_OTHER): Payer: Medicare Other | Admitting: Surgery

## 2012-12-15 ENCOUNTER — Encounter (INDEPENDENT_AMBULATORY_CARE_PROVIDER_SITE_OTHER): Payer: Self-pay | Admitting: Surgery

## 2012-12-15 VITALS — BP 140/90 | HR 72 | Temp 97.8°F | Resp 15 | Ht 70.0 in | Wt 175.8 lb

## 2012-12-15 DIAGNOSIS — K430 Incisional hernia with obstruction, without gangrene: Secondary | ICD-10-CM

## 2012-12-15 NOTE — Progress Notes (Signed)
Status post laparoscopic ventral hernia repaired with mesh on 12/01/12. The patient had a total of 4 small defects clustered around his umbilicus. He is doing quite well. His incisions are all well-healed with no sign of infection. No sign of recurrent ventral hernia. He still has some slight bulging at his hernias but these are only seromas. These resolve quickly when he is supine. He is no longer using any pain medication.  He should continue limiting his level of activity for the next few weeks. We will reevaluate him in 3 weeks. He may drive his riding lawnmower but should refrain from any lifting over 20 pounds.  Wilmon Arms. Corliss Skains, MD, [facility]  General/ Trauma Surgery  12/15/2012 11:21 AM

## 2013-01-06 ENCOUNTER — Ambulatory Visit (INDEPENDENT_AMBULATORY_CARE_PROVIDER_SITE_OTHER): Payer: Medicare Other | Admitting: Surgery

## 2013-01-06 ENCOUNTER — Encounter (INDEPENDENT_AMBULATORY_CARE_PROVIDER_SITE_OTHER): Payer: Self-pay | Admitting: Surgery

## 2013-01-06 VITALS — BP 122/70 | HR 62 | Temp 97.0°F | Resp 16 | Ht 70.0 in | Wt 177.6 lb

## 2013-01-06 DIAGNOSIS — K430 Incisional hernia with obstruction, without gangrene: Secondary | ICD-10-CM

## 2013-01-06 NOTE — Progress Notes (Signed)
Status post laparoscopic ventral hernia repair with mesh on 12/01/12. The patient is doing quite well. The abdominal soreness is almost completely resolved. He still has some slight soft bulging at his hernia defects but these are empty. No sign of recurrent hernia at this time. His incisions are well-healed with no sign of infection. He may resume full attending next week. Follow up as needed.  Wilmon Arms. Corliss Skains, MD, [facility] Surgery  General/ Trauma Surgery  01/06/2013 10:59 AM

## 2013-10-26 ENCOUNTER — Other Ambulatory Visit: Payer: Self-pay | Admitting: Orthopaedic Surgery

## 2013-10-26 DIAGNOSIS — M545 Low back pain, unspecified: Secondary | ICD-10-CM

## 2015-04-29 DIAGNOSIS — Z23 Encounter for immunization: Secondary | ICD-10-CM | POA: Diagnosis not present

## 2015-04-29 DIAGNOSIS — Z79899 Other long term (current) drug therapy: Secondary | ICD-10-CM | POA: Diagnosis not present

## 2015-04-29 DIAGNOSIS — Z0000 Encounter for general adult medical examination without abnormal findings: Secondary | ICD-10-CM | POA: Diagnosis not present

## 2015-04-29 DIAGNOSIS — Z9181 History of falling: Secondary | ICD-10-CM | POA: Diagnosis not present

## 2015-04-29 DIAGNOSIS — Z125 Encounter for screening for malignant neoplasm of prostate: Secondary | ICD-10-CM | POA: Diagnosis not present

## 2015-04-29 DIAGNOSIS — R312 Other microscopic hematuria: Secondary | ICD-10-CM | POA: Diagnosis not present

## 2015-04-29 DIAGNOSIS — R319 Hematuria, unspecified: Secondary | ICD-10-CM | POA: Diagnosis not present

## 2015-04-29 DIAGNOSIS — Z0001 Encounter for general adult medical examination with abnormal findings: Secondary | ICD-10-CM | POA: Diagnosis not present

## 2015-04-29 DIAGNOSIS — Z319 Encounter for procreative management, unspecified: Secondary | ICD-10-CM | POA: Diagnosis not present

## 2015-04-29 DIAGNOSIS — R109 Unspecified abdominal pain: Secondary | ICD-10-CM | POA: Diagnosis not present

## 2015-04-29 DIAGNOSIS — Z1159 Encounter for screening for other viral diseases: Secondary | ICD-10-CM | POA: Diagnosis not present

## 2015-10-27 DIAGNOSIS — H5203 Hypermetropia, bilateral: Secondary | ICD-10-CM | POA: Diagnosis not present

## 2015-10-27 DIAGNOSIS — H25813 Combined forms of age-related cataract, bilateral: Secondary | ICD-10-CM | POA: Diagnosis not present

## 2016-04-30 DIAGNOSIS — Z9049 Acquired absence of other specified parts of digestive tract: Secondary | ICD-10-CM | POA: Diagnosis not present

## 2016-04-30 DIAGNOSIS — Z0000 Encounter for general adult medical examination without abnormal findings: Secondary | ICD-10-CM | POA: Diagnosis not present

## 2016-04-30 DIAGNOSIS — Z125 Encounter for screening for malignant neoplasm of prostate: Secondary | ICD-10-CM | POA: Diagnosis not present

## 2016-04-30 DIAGNOSIS — Z9181 History of falling: Secondary | ICD-10-CM | POA: Diagnosis not present

## 2016-04-30 DIAGNOSIS — Z23 Encounter for immunization: Secondary | ICD-10-CM | POA: Diagnosis not present

## 2016-07-19 DIAGNOSIS — B079 Viral wart, unspecified: Secondary | ICD-10-CM | POA: Diagnosis not present

## 2016-07-19 DIAGNOSIS — L570 Actinic keratosis: Secondary | ICD-10-CM | POA: Diagnosis not present

## 2016-07-19 DIAGNOSIS — C4442 Squamous cell carcinoma of skin of scalp and neck: Secondary | ICD-10-CM | POA: Diagnosis not present

## 2016-07-27 DIAGNOSIS — C4441 Basal cell carcinoma of skin of scalp and neck: Secondary | ICD-10-CM | POA: Diagnosis not present

## 2016-08-14 DIAGNOSIS — B079 Viral wart, unspecified: Secondary | ICD-10-CM | POA: Diagnosis not present

## 2016-10-23 DIAGNOSIS — H6121 Impacted cerumen, right ear: Secondary | ICD-10-CM | POA: Diagnosis not present

## 2016-10-23 DIAGNOSIS — Z6824 Body mass index (BMI) 24.0-24.9, adult: Secondary | ICD-10-CM | POA: Diagnosis not present

## 2016-10-23 DIAGNOSIS — R030 Elevated blood-pressure reading, without diagnosis of hypertension: Secondary | ICD-10-CM | POA: Diagnosis not present

## 2016-11-21 DIAGNOSIS — I10 Essential (primary) hypertension: Secondary | ICD-10-CM | POA: Diagnosis not present

## 2016-11-21 DIAGNOSIS — H6122 Impacted cerumen, left ear: Secondary | ICD-10-CM | POA: Diagnosis not present

## 2016-11-21 DIAGNOSIS — Z6823 Body mass index (BMI) 23.0-23.9, adult: Secondary | ICD-10-CM | POA: Diagnosis not present

## 2017-04-04 DIAGNOSIS — M545 Low back pain: Secondary | ICD-10-CM | POA: Diagnosis not present

## 2017-04-04 DIAGNOSIS — Z6824 Body mass index (BMI) 24.0-24.9, adult: Secondary | ICD-10-CM | POA: Diagnosis not present

## 2017-05-09 DIAGNOSIS — Z125 Encounter for screening for malignant neoplasm of prostate: Secondary | ICD-10-CM | POA: Diagnosis not present

## 2017-05-09 DIAGNOSIS — Z23 Encounter for immunization: Secondary | ICD-10-CM | POA: Diagnosis not present

## 2017-05-09 DIAGNOSIS — I10 Essential (primary) hypertension: Secondary | ICD-10-CM | POA: Diagnosis not present

## 2017-05-09 DIAGNOSIS — Z0000 Encounter for general adult medical examination without abnormal findings: Secondary | ICD-10-CM | POA: Diagnosis not present

## 2017-05-09 DIAGNOSIS — Z9181 History of falling: Secondary | ICD-10-CM | POA: Diagnosis not present

## 2017-05-09 DIAGNOSIS — Z1339 Encounter for screening examination for other mental health and behavioral disorders: Secondary | ICD-10-CM | POA: Diagnosis not present

## 2017-05-09 DIAGNOSIS — Z1331 Encounter for screening for depression: Secondary | ICD-10-CM | POA: Diagnosis not present

## 2017-07-16 DIAGNOSIS — Z9049 Acquired absence of other specified parts of digestive tract: Secondary | ICD-10-CM | POA: Diagnosis not present

## 2017-07-16 DIAGNOSIS — Z0000 Encounter for general adult medical examination without abnormal findings: Secondary | ICD-10-CM | POA: Diagnosis not present

## 2017-07-16 DIAGNOSIS — Z6824 Body mass index (BMI) 24.0-24.9, adult: Secondary | ICD-10-CM | POA: Diagnosis not present

## 2017-10-08 DIAGNOSIS — M79671 Pain in right foot: Secondary | ICD-10-CM | POA: Diagnosis not present

## 2017-10-08 DIAGNOSIS — Z6824 Body mass index (BMI) 24.0-24.9, adult: Secondary | ICD-10-CM | POA: Diagnosis not present

## 2017-11-18 ENCOUNTER — Ambulatory Visit: Payer: Medicare Other | Admitting: Podiatry

## 2017-11-19 ENCOUNTER — Ambulatory Visit: Payer: Medicare Other | Admitting: Podiatry

## 2017-11-19 ENCOUNTER — Ambulatory Visit: Payer: Self-pay

## 2017-11-19 DIAGNOSIS — M722 Plantar fascial fibromatosis: Secondary | ICD-10-CM

## 2017-11-19 DIAGNOSIS — M216X9 Other acquired deformities of unspecified foot: Secondary | ICD-10-CM | POA: Diagnosis not present

## 2017-11-19 NOTE — Patient Instructions (Signed)

## 2017-11-19 NOTE — Progress Notes (Signed)
   Subjective:    Patient ID: Eric Giles, male    DOB: [birth year], 72 y.o.   MRN: [id number]  HPI    Review of Systems  Musculoskeletal: Positive for arthralgias, gait problem, joint swelling and myalgias.  All other systems reviewed and are negative.      Objective:   Physical Exam        Assessment & Plan:

## 2017-11-20 DIAGNOSIS — L720 Epidermal cyst: Secondary | ICD-10-CM | POA: Diagnosis not present

## 2017-11-20 DIAGNOSIS — L820 Inflamed seborrheic keratosis: Secondary | ICD-10-CM | POA: Diagnosis not present

## 2017-12-08 NOTE — Progress Notes (Signed)
  Subjective:  Patient ID: Eric Giles, male    DOB: 12-Oct-1945,  MRN: [id number]  Chief Complaint  Patient presents with  . Foot Pain    R bottom heel x 4 mo; 10/10 constant sharp pain Tx: OTC heel lifts, and meloxicam (Rx by [facility] for bone spur, not helping) Pt. stated," on April 30th I went to [facility] for the same problem and they took x-rays and dx me with a bone spur."    Reports pain to the bottom of the right heel for 4 months 10 out of 10 constant sharp pain has tried over-the-counter illness meloxicam 72 y.o. male presents with the above complaint.  Told he had a bone spur.  Past Medical History:  Diagnosis Date  . Chronic back pain   . Medical history non-contributory   . Ulcerative colitis    hx of   Past Surgical History:  Procedure Laterality Date  . BACK SURGERY    . COLON SURGERY    . HERNIA REPAIR    . INSERTION OF MESH N/A 12/01/2012   Procedure: INSERTION OF MESH;  Surgeon: Imogene Burn. Georgette Dover, MD;  Location: [facility];  Service: General;  Laterality: N/A;  . NOSE SURGERY    . VENTRAL HERNIA REPAIR N/A 12/01/2012   Procedure: LAPAROSCOPIC VENTRAL HERNIA;  Surgeon: Imogene Burn. Tsuei, MD;  Location: [facility];  Service: General;  Laterality: N/A;    Current Outpatient Medications:  .  ciprofloxacin (CIPRO) 500 MG/5ML (10%) suspension, Take by mouth 2 (two) times daily., Disp: , Rfl:   No Known Allergies Review of Systems: Negative except as noted in the HPI. Denies N/V/F/Ch. Objective:  There were no vitals filed for this visit. General AA&O x3. Normal mood and affect.  Vascular Dorsalis pedis and posterior tibial pulses  present 2+ bilaterally  Capillary refill normal to all digits. Pedal hair growth normal.  Neurologic Epicritic sensation grossly present.  Dermatologic No open lesions. Interspaces clear of maceration. Nails well groomed and normal in appearance.  Orthopedic: MMT 5/5 in dorsiflexion, plantarflexion, inversion, and eversion. Normal joint ROM  without pain or crepitus. POP R medial calc tuber, decreased AJ ROM.   Assessment & Plan:  Patient was evaluated and treated and all questions answered.  Plantar Fasciitis, right - XR reviewed as above.  - Educated on icing and stretching. Instructions given.  - Injection delivered to the plantar fascia as below. - Night splint dispensed.  Procedure: Injection Tendon/Ligament Location: Right plantar fascia at the glabrous junction; medial approach. Skin Prep: Alcohol. Injectate: 1 cc 0.5% marcaine plain, 1 cc dexamethasone phosphate, 0.5 cc kenalog 10. Disposition: Patient tolerated procedure well. Injection site dressed with a band-aid.  Return in about 3 weeks (around 12/10/2017) for Plantar fasciitis, Right.

## 2017-12-10 ENCOUNTER — Ambulatory Visit: Payer: Medicare Other | Admitting: Podiatry

## 2018-02-18 DIAGNOSIS — D492 Neoplasm of unspecified behavior of bone, soft tissue, and skin: Secondary | ICD-10-CM | POA: Diagnosis not present

## 2018-02-18 DIAGNOSIS — Z6823 Body mass index (BMI) 23.0-23.9, adult: Secondary | ICD-10-CM | POA: Diagnosis not present

## 2018-02-26 DIAGNOSIS — L720 Epidermal cyst: Secondary | ICD-10-CM | POA: Diagnosis not present

## 2018-05-23 ENCOUNTER — Other Ambulatory Visit: Payer: Self-pay | Admitting: Student

## 2018-05-23 DIAGNOSIS — M5441 Lumbago with sciatica, right side: Principal | ICD-10-CM

## 2018-05-23 DIAGNOSIS — G8929 Other chronic pain: Secondary | ICD-10-CM

## 2018-06-06 ENCOUNTER — Ambulatory Visit
Admission: RE | Admit: 2018-06-06 | Discharge: 2018-06-06 | Disposition: A | Discharge status: Home / Self Care | Payer: Medicare Other | Source: Ambulatory Visit | Attending: Student | Admitting: Student

## 2018-06-06 DIAGNOSIS — M5441 Lumbago with sciatica, right side: Principal | ICD-10-CM

## 2018-06-06 DIAGNOSIS — G8929 Other chronic pain: Secondary | ICD-10-CM

## 2018-06-06 MED ORDER — GADOBENATE DIMEGLUMINE 529 MG/ML IV SOLN
7.0000 mL | Freq: Once | INTRAVENOUS | Status: AC | PRN
  Administered 2018-06-06: 7 mL via INTRAVENOUS

## 2018-06-16 ENCOUNTER — Other Ambulatory Visit: Payer: Self-pay | Admitting: Student

## 2018-06-16 DIAGNOSIS — G8929 Other chronic pain: Secondary | ICD-10-CM

## 2018-06-16 DIAGNOSIS — M5441 Lumbago with sciatica, right side: Principal | ICD-10-CM

## 2018-06-24 ENCOUNTER — Ambulatory Visit
Admission: RE | Admit: 2018-06-24 | Discharge: 2018-06-24 | Disposition: A | Discharge status: Home / Self Care | Payer: Medicare Other | Source: Ambulatory Visit | Attending: Student | Admitting: Student

## 2018-06-24 ENCOUNTER — Other Ambulatory Visit: Payer: Self-pay | Admitting: Student

## 2018-06-24 DIAGNOSIS — G8929 Other chronic pain: Secondary | ICD-10-CM

## 2018-06-24 DIAGNOSIS — M5441 Lumbago with sciatica, right side: Principal | ICD-10-CM

## 2018-06-24 MED ORDER — METHYLPREDNISOLONE ACETATE 40 MG/ML INJ SUSP (RADIOLOG
120.0000 mg | Freq: Once | INTRAMUSCULAR | Status: AC
  Administered 2018-06-24: 120 mg via INTRA_ARTICULAR

## 2018-06-24 MED ORDER — IOPAMIDOL (ISOVUE-M 200) INJECTION 41%
1.0000 mL | Freq: Once | INTRAMUSCULAR | Status: AC
  Administered 2018-06-24: 1 mL via INTRA_ARTICULAR

## 2018-06-24 NOTE — Discharge Instructions (Signed)

## 2018-10-16 DIAGNOSIS — M5441 Lumbago with sciatica, right side: Secondary | ICD-10-CM | POA: Diagnosis not present

## 2019-02-25 DIAGNOSIS — Z23 Encounter for immunization: Secondary | ICD-10-CM | POA: Diagnosis not present

## 2019-02-25 DIAGNOSIS — Z6823 Body mass index (BMI) 23.0-23.9, adult: Secondary | ICD-10-CM | POA: Diagnosis not present

## 2019-02-25 DIAGNOSIS — B356 Tinea cruris: Secondary | ICD-10-CM | POA: Diagnosis not present

## 2019-03-09 DIAGNOSIS — C44311 Basal cell carcinoma of skin of nose: Secondary | ICD-10-CM | POA: Diagnosis not present

## 2019-03-16 DIAGNOSIS — Z6823 Body mass index (BMI) 23.0-23.9, adult: Secondary | ICD-10-CM | POA: Diagnosis not present

## 2019-03-16 DIAGNOSIS — N476 Balanoposthitis: Secondary | ICD-10-CM | POA: Diagnosis not present

## 2019-04-30 DIAGNOSIS — M539 Dorsopathy, unspecified: Secondary | ICD-10-CM | POA: Diagnosis not present

## 2019-04-30 DIAGNOSIS — K5190 Ulcerative colitis, unspecified, without complications: Secondary | ICD-10-CM | POA: Diagnosis not present

## 2019-04-30 DIAGNOSIS — Z0000 Encounter for general adult medical examination without abnormal findings: Secondary | ICD-10-CM | POA: Diagnosis not present

## 2019-04-30 DIAGNOSIS — Z6823 Body mass index (BMI) 23.0-23.9, adult: Secondary | ICD-10-CM | POA: Diagnosis not present

## 2019-04-30 DIAGNOSIS — L309 Dermatitis, unspecified: Secondary | ICD-10-CM | POA: Diagnosis not present

## 2019-05-01 DIAGNOSIS — E785 Hyperlipidemia, unspecified: Secondary | ICD-10-CM | POA: Diagnosis not present

## 2019-05-01 DIAGNOSIS — R739 Hyperglycemia, unspecified: Secondary | ICD-10-CM | POA: Diagnosis not present

## 2019-05-01 DIAGNOSIS — Z79899 Other long term (current) drug therapy: Secondary | ICD-10-CM | POA: Diagnosis not present

## 2019-06-10 DIAGNOSIS — Z6823 Body mass index (BMI) 23.0-23.9, adult: Secondary | ICD-10-CM | POA: Diagnosis not present

## 2019-06-10 DIAGNOSIS — K5190 Ulcerative colitis, unspecified, without complications: Secondary | ICD-10-CM | POA: Diagnosis not present

## 2019-06-10 DIAGNOSIS — Z9049 Acquired absence of other specified parts of digestive tract: Secondary | ICD-10-CM | POA: Diagnosis not present

## 2019-06-10 DIAGNOSIS — K91850 Pouchitis: Secondary | ICD-10-CM | POA: Diagnosis not present

## 2019-06-10 DIAGNOSIS — N183 Chronic kidney disease, stage 3 unspecified: Secondary | ICD-10-CM | POA: Diagnosis not present

## 2019-12-10 DIAGNOSIS — I219 Acute myocardial infarction, unspecified: Secondary | ICD-10-CM

## 2019-12-10 HISTORY — DX: Acute myocardial infarction, unspecified: I21.9

## 2019-12-24 DIAGNOSIS — M545 Low back pain: Secondary | ICD-10-CM | POA: Diagnosis not present

## 2019-12-28 DIAGNOSIS — I213 ST elevation (STEMI) myocardial infarction of unspecified site: Secondary | ICD-10-CM | POA: Diagnosis not present

## 2019-12-28 DIAGNOSIS — I951 Orthostatic hypotension: Secondary | ICD-10-CM | POA: Diagnosis not present

## 2019-12-28 DIAGNOSIS — Z955 Presence of coronary angioplasty implant and graft: Secondary | ICD-10-CM | POA: Diagnosis not present

## 2019-12-28 DIAGNOSIS — M5136 Other intervertebral disc degeneration, lumbar region: Secondary | ICD-10-CM | POA: Diagnosis not present

## 2019-12-28 DIAGNOSIS — I499 Cardiac arrhythmia, unspecified: Secondary | ICD-10-CM | POA: Diagnosis not present

## 2019-12-28 DIAGNOSIS — R001 Bradycardia, unspecified: Secondary | ICD-10-CM | POA: Diagnosis not present

## 2019-12-28 DIAGNOSIS — I208 Other forms of angina pectoris: Secondary | ICD-10-CM | POA: Diagnosis not present

## 2019-12-28 DIAGNOSIS — I501 Left ventricular failure: Secondary | ICD-10-CM | POA: Diagnosis not present

## 2019-12-28 DIAGNOSIS — I249 Acute ischemic heart disease, unspecified: Secondary | ICD-10-CM | POA: Diagnosis not present

## 2019-12-28 DIAGNOSIS — I2510 Atherosclerotic heart disease of native coronary artery without angina pectoris: Secondary | ICD-10-CM | POA: Diagnosis not present

## 2019-12-28 DIAGNOSIS — N189 Chronic kidney disease, unspecified: Secondary | ICD-10-CM | POA: Diagnosis not present

## 2019-12-28 DIAGNOSIS — R778 Other specified abnormalities of plasma proteins: Secondary | ICD-10-CM | POA: Diagnosis not present

## 2019-12-28 DIAGNOSIS — I4519 Other right bundle-branch block: Secondary | ICD-10-CM | POA: Diagnosis not present

## 2019-12-28 DIAGNOSIS — Z9049 Acquired absence of other specified parts of digestive tract: Secondary | ICD-10-CM | POA: Diagnosis not present

## 2019-12-28 DIAGNOSIS — R0789 Other chest pain: Secondary | ICD-10-CM | POA: Diagnosis not present

## 2019-12-28 DIAGNOSIS — I200 Unstable angina: Secondary | ICD-10-CM | POA: Diagnosis not present

## 2019-12-28 DIAGNOSIS — J9811 Atelectasis: Secondary | ICD-10-CM | POA: Diagnosis not present

## 2019-12-28 DIAGNOSIS — Z743 Need for continuous supervision: Secondary | ICD-10-CM | POA: Diagnosis not present

## 2019-12-28 DIAGNOSIS — N289 Disorder of kidney and ureter, unspecified: Secondary | ICD-10-CM | POA: Diagnosis not present

## 2019-12-28 DIAGNOSIS — R739 Hyperglycemia, unspecified: Secondary | ICD-10-CM | POA: Diagnosis not present

## 2019-12-28 DIAGNOSIS — R9431 Abnormal electrocardiogram [ECG] [EKG]: Secondary | ICD-10-CM | POA: Diagnosis not present

## 2019-12-28 DIAGNOSIS — I214 Non-ST elevation (NSTEMI) myocardial infarction: Secondary | ICD-10-CM | POA: Diagnosis not present

## 2019-12-28 DIAGNOSIS — I25110 Atherosclerotic heart disease of native coronary artery with unstable angina pectoris: Secondary | ICD-10-CM | POA: Diagnosis not present

## 2019-12-28 DIAGNOSIS — I4510 Unspecified right bundle-branch block: Secondary | ICD-10-CM | POA: Diagnosis not present

## 2019-12-28 DIAGNOSIS — R002 Palpitations: Secondary | ICD-10-CM | POA: Diagnosis not present

## 2019-12-28 DIAGNOSIS — R55 Syncope and collapse: Secondary | ICD-10-CM | POA: Diagnosis not present

## 2019-12-28 DIAGNOSIS — R079 Chest pain, unspecified: Secondary | ICD-10-CM | POA: Diagnosis not present

## 2019-12-30 DIAGNOSIS — R55 Syncope and collapse: Secondary | ICD-10-CM | POA: Diagnosis not present

## 2019-12-30 DIAGNOSIS — J9811 Atelectasis: Secondary | ICD-10-CM | POA: Diagnosis not present

## 2019-12-30 DIAGNOSIS — Z955 Presence of coronary angioplasty implant and graft: Secondary | ICD-10-CM | POA: Diagnosis not present

## 2019-12-30 DIAGNOSIS — R778 Other specified abnormalities of plasma proteins: Secondary | ICD-10-CM | POA: Diagnosis not present

## 2019-12-30 DIAGNOSIS — R9431 Abnormal electrocardiogram [ECG] [EKG]: Secondary | ICD-10-CM | POA: Diagnosis not present

## 2019-12-30 DIAGNOSIS — N189 Chronic kidney disease, unspecified: Secondary | ICD-10-CM | POA: Diagnosis not present

## 2019-12-30 DIAGNOSIS — I951 Orthostatic hypotension: Secondary | ICD-10-CM | POA: Diagnosis not present

## 2019-12-30 DIAGNOSIS — I4519 Other right bundle-branch block: Secondary | ICD-10-CM | POA: Diagnosis not present

## 2019-12-30 DIAGNOSIS — I4510 Unspecified right bundle-branch block: Secondary | ICD-10-CM | POA: Diagnosis not present

## 2019-12-30 DIAGNOSIS — R001 Bradycardia, unspecified: Secondary | ICD-10-CM | POA: Diagnosis not present

## 2020-01-12 DIAGNOSIS — I10 Essential (primary) hypertension: Secondary | ICD-10-CM | POA: Diagnosis not present

## 2020-01-12 DIAGNOSIS — E782 Mixed hyperlipidemia: Secondary | ICD-10-CM | POA: Diagnosis not present

## 2020-01-12 DIAGNOSIS — Z7982 Long term (current) use of aspirin: Secondary | ICD-10-CM | POA: Diagnosis not present

## 2020-01-12 DIAGNOSIS — I2510 Atherosclerotic heart disease of native coronary artery without angina pectoris: Secondary | ICD-10-CM | POA: Diagnosis not present

## 2020-01-12 DIAGNOSIS — I255 Ischemic cardiomyopathy: Secondary | ICD-10-CM | POA: Diagnosis not present

## 2020-03-25 DIAGNOSIS — K611 Rectal abscess: Secondary | ICD-10-CM | POA: Diagnosis not present

## 2020-03-28 DIAGNOSIS — N3289 Other specified disorders of bladder: Secondary | ICD-10-CM | POA: Diagnosis not present

## 2020-03-28 DIAGNOSIS — K6289 Other specified diseases of anus and rectum: Secondary | ICD-10-CM | POA: Diagnosis not present

## 2020-03-28 DIAGNOSIS — K603 Anal fistula: Secondary | ICD-10-CM | POA: Diagnosis not present

## 2020-03-28 DIAGNOSIS — K632 Fistula of intestine: Secondary | ICD-10-CM | POA: Diagnosis not present

## 2020-03-29 DIAGNOSIS — K603 Anal fistula: Secondary | ICD-10-CM | POA: Diagnosis not present

## 2020-04-14 DIAGNOSIS — I255 Ischemic cardiomyopathy: Secondary | ICD-10-CM | POA: Diagnosis not present

## 2020-04-14 DIAGNOSIS — I10 Essential (primary) hypertension: Secondary | ICD-10-CM | POA: Diagnosis not present

## 2020-04-14 DIAGNOSIS — I2510 Atherosclerotic heart disease of native coronary artery without angina pectoris: Secondary | ICD-10-CM | POA: Diagnosis not present

## 2020-04-14 DIAGNOSIS — E782 Mixed hyperlipidemia: Secondary | ICD-10-CM | POA: Diagnosis not present

## 2020-04-14 DIAGNOSIS — Z7982 Long term (current) use of aspirin: Secondary | ICD-10-CM | POA: Diagnosis not present

## 2020-04-19 DIAGNOSIS — Z8719 Personal history of other diseases of the digestive system: Secondary | ICD-10-CM | POA: Diagnosis not present

## 2020-04-19 DIAGNOSIS — K603 Anal fistula: Secondary | ICD-10-CM | POA: Diagnosis not present

## 2020-05-03 DIAGNOSIS — M9903 Segmental and somatic dysfunction of lumbar region: Secondary | ICD-10-CM | POA: Diagnosis not present

## 2020-05-03 DIAGNOSIS — M9905 Segmental and somatic dysfunction of pelvic region: Secondary | ICD-10-CM | POA: Diagnosis not present

## 2020-05-03 DIAGNOSIS — M5451 Vertebrogenic low back pain: Secondary | ICD-10-CM | POA: Diagnosis not present

## 2020-05-11 NOTE — Progress Notes (Signed)
DUE TO COVID-19 ONLY ONE VISITOR IS ALLOWED TO COME WITH YOU AND STAY IN THE WAITING ROOM ONLY DURING PRE OP AND PROCEDURE DAY OF SURGERY. THE 1 VISITOR  MAY VISIT WITH YOU AFTER SURGERY IN YOUR PRIVATE ROOM DURING VISITING HOURS ONLY!  YOU NEED TO HAVE A COVID 19 TEST ON___12/09/2019 ____ @_______ , THIS TEST MUST BE DONE BEFORE SURGERY,  COVID TESTING SITE [address] [place] [address], IT IS ON THE RIGHT GOING OUT [address] APPROXIMATELY  2 MINUTES PAST [employer] ON THE RIGHT. ONCE YOUR COVID TEST IS COMPLETED,  PLEASE BEGIN THE QUARANTINE INSTRUCTIONS AS OUTLINED IN YOUR HANDOUT.                Eric Giles  05/11/2020   Your procedure is scheduled on: 05/18/2020   Report to [facility] Main  Entrance   Report to admitting at   0630am  AM     Call this number if you have problems the morning of surgery [phone]    Remember: Do not eat food , candy gum or mints :After Midnight. You may have clear liquids from midnight until   0530am     CLEAR LIQUID DIET   Foods Allowed                                                                       Coffee and tea, regular and decaf                              Plain Jell-O any favor except red or purple                                            Fruit ices (not with fruit pulp)                                      Iced Popsicles                                     Carbonated beverages, regular and diet                                    Cranberry, grape and apple juices Sports drinks like Gatorade Lightly seasoned clear broth or consume(fat free) Sugar, honey syrup   _____________________________________________________________________    BRUSH YOUR TEETH MORNING OF SURGERY AND RINSE YOUR MOUTH OUT, NO CHEWING GUM CANDY OR MINTS.     Take these medicines the morning of surgery with A SIP OF WATER: metoprolol   DO NOT TAKE ANY DIABETIC MEDICATIONS DAY OF YOUR SURGERY                                You may not have any metal on  your body including hair pins and              piercings  Do not wear jewelry, make-up, lotions, powders or perfumes, deodorant             Do not wear nail polish on your fingernails.  Do not shave  48 hours prior to surgery.              Men may shave face and neck.   Do not bring valuables to the hospital. [place].  Contacts, dentures or bridgework may not be worn into surgery.  Leave suitcase in the car. After surgery it may be brought to your room.     Patients discharged the day of surgery will not be allowed to drive home. IF YOU ARE HAVING SURGERY AND GOING HOME THE SAME DAY, YOU MUST HAVE AN ADULT TO DRIVE YOU HOME AND BE WITH YOU FOR 24 HOURS. YOU MAY GO HOME BY TAXI OR UBER OR ORTHERWISE, BUT AN ADULT MUST ACCOMPANY YOU HOME AND STAY WITH YOU FOR 24 HOURS.  Name and phone number of your driver:  Special Instructions: N/A              Please read over the following fact sheets you were given: _____________________________________________________________________  [facility] - Preparing for Surgery Before surgery, you can play an important role.  Because skin is not sterile, your skin needs to be as free of germs as possible.  You can reduce the number of germs on your skin by washing with CHG (chlorahexidine gluconate) soap before surgery.  CHG is an antiseptic cleaner which kills germs and bonds with the skin to continue killing germs even after washing. Please DO NOT use if you have an allergy to CHG or antibacterial soaps.  If your skin becomes reddened/irritated stop using the CHG and inform your nurse when you arrive at Short Stay. Do not shave (including legs and underarms) for at least 48 hours prior to the first CHG shower.  You may shave your face/neck. Please follow these instructions carefully:  1.  Shower with CHG Soap the night before surgery and the  morning of Surgery.  2.  If you  choose to wash your hair, wash your hair first as usual with your  normal  shampoo.  3.  After you shampoo, rinse your hair and body thoroughly to remove the  shampoo.                           4.  Use CHG as you would any other liquid soap.  You can apply chg directly  to the skin and wash                       Gently with a scrungie or clean washcloth.  5.  Apply the CHG Soap to your body ONLY FROM THE NECK DOWN.   Do not use on face/ open                           Wound or open sores. Avoid contact with eyes, ears mouth and genitals (private parts).                       Wash face,  Genitals (  private parts) with your normal soap.             6.  Wash thoroughly, paying special attention to the area where your surgery  will be performed.  7.  Thoroughly rinse your body with warm water from the neck down.  8.  DO NOT shower/wash with your normal soap after using and rinsing off  the CHG Soap.                9.  Pat yourself dry with a clean towel.            10.  Wear clean pajamas.            11.  Place clean sheets on your bed the night of your first shower and do not  sleep with pets. Day of Surgery : Do not apply any lotions/deodorants the morning of surgery.  Please wear clean clothes to the hospital/surgery center.  FAILURE TO FOLLOW THESE INSTRUCTIONS MAY RESULT IN THE CANCELLATION OF YOUR SURGERY PATIENT SIGNATURE_________________________________  NURSE SIGNATURE__________________________________  ________________________________________________________________________

## 2020-05-12 ENCOUNTER — Other Ambulatory Visit: Payer: Self-pay

## 2020-05-12 ENCOUNTER — Encounter (HOSPITAL_COMMUNITY)
Admission: RE | Admit: 2020-05-12 | Discharge: 2020-05-12 | Disposition: A | Discharge status: Home / Self Care | Payer: Medicare Other | Source: Ambulatory Visit | Attending: Surgery | Admitting: Surgery

## 2020-05-12 ENCOUNTER — Encounter (HOSPITAL_COMMUNITY): Payer: Self-pay

## 2020-05-12 DIAGNOSIS — Z87891 Personal history of nicotine dependence: Secondary | ICD-10-CM | POA: Insufficient documentation

## 2020-05-12 DIAGNOSIS — K603 Anal fistula: Secondary | ICD-10-CM | POA: Insufficient documentation

## 2020-05-12 DIAGNOSIS — I10 Essential (primary) hypertension: Secondary | ICD-10-CM | POA: Insufficient documentation

## 2020-05-12 DIAGNOSIS — Z01812 Encounter for preprocedural laboratory examination: Secondary | ICD-10-CM | POA: Diagnosis not present

## 2020-05-12 DIAGNOSIS — Z7982 Long term (current) use of aspirin: Secondary | ICD-10-CM | POA: Insufficient documentation

## 2020-05-12 DIAGNOSIS — Z7901 Long term (current) use of anticoagulants: Secondary | ICD-10-CM | POA: Insufficient documentation

## 2020-05-12 DIAGNOSIS — Z8719 Personal history of other diseases of the digestive system: Secondary | ICD-10-CM | POA: Diagnosis not present

## 2020-05-12 DIAGNOSIS — I2510 Atherosclerotic heart disease of native coronary artery without angina pectoris: Secondary | ICD-10-CM | POA: Insufficient documentation

## 2020-05-12 HISTORY — DX: Essential (primary) hypertension: I10

## 2020-05-12 HISTORY — DX: Atherosclerotic heart disease of native coronary artery without angina pectoris: I25.10

## 2020-05-12 HISTORY — DX: Personal history of urinary calculi: Z87.442

## 2020-05-12 HISTORY — DX: Acute myocardial infarction, unspecified: I21.9

## 2020-05-12 LAB — BASIC METABOLIC PANEL
Anion gap: 10 (ref 5–15)
BUN: 18 mg/dL (ref 8–23)
CO2: 25 mmol/L (ref 22–32)
Calcium: 9.3 mg/dL (ref 8.9–10.3)
Chloride: 99 mmol/L (ref 98–111)
Creatinine, Ser: 1.76 mg/dL — ABNORMAL HIGH (ref 0.61–1.24)
GFR, Estimated: 41 mL/min — ABNORMAL LOW (ref >60)
Glucose, Bld: 144 mg/dL — ABNORMAL HIGH (ref 70–99)
Potassium: 4.3 mmol/L (ref 3.5–5.1)
Sodium: 134 mmol/L — ABNORMAL LOW (ref 135–145)

## 2020-05-12 LAB — CBC
HCT: 38.5 % — ABNORMAL LOW (ref 39.0–52.0)
Hemoglobin: 12.4 g/dL — ABNORMAL LOW (ref 13.0–17.0)
MCH: 30.4 pg (ref 26.0–34.0)
MCHC: 32.2 g/dL (ref 30.0–36.0)
MCV: 94.4 fL (ref 80.0–100.0)
Platelets: 289 10*3/uL (ref 150–400)
RBC: 4.08 MIL/uL — ABNORMAL LOW (ref 4.22–5.81)
RDW: 13.4 % (ref 11.5–15.5)
WBC: 10.1 10*3/uL (ref 4.0–10.5)
nRBC: 0 % (ref 0.0–0.2)

## 2020-05-12 NOTE — Progress Notes (Addendum)
Anesthesia Review:  PCP: Cardiologist Dr Beatrix Fetters - LOV 04/14/2020 Clearance has been received at [facility] .Marland Kitchen  Called and requested them to fax on 05/12/2020 Clearance on chart Roanna Banning aware.  Chest x-ray : EKG :12/2019  Echo : Stress test: Cardiac Cath : 12/2019  Activity level: can do a flight of stairs without difficulty  Sleep Study/ CPAP : Fasting Blood Sugar :      / Checks Blood Sugar -- times a day:   Blood Thinner/ Instructions /Last Dose: ASA / Instructions/ Last Dose :  Plavix and 81 mg ASA- Pt at time of preop on 05/12/2020 has not received any preop instructions regarding ASA or Plavix.  Informed pt that I would call the office of DR White and either DR White's office or Dre McGukin's office would give him a call regarding ASA and Plavix preop instructions.  Informed pt that at preop appt we do not give any Instructions regarding ASA or Plavix.  Called office of Dr Dema Severin and spoke with Kennyth Lose in Triage and informed that pt has not yet been given any instructions regarding ASA or Plavix.  Just had Mi in 12/2019 with stents and placed on Plavix at that time.  Kennyth Lose stated would call office of DR McGukin regarding preop Plavix instructions and pt would be notiffied.  Kennyth Lose did stase she had clearance from DR McGukin and she will fax to [facility]  Routed BMp results done 05/12/2020 to Dr Deland Pretty. Roanna Banning made aware of abovve.  Have requested 12 leak ekg tracings, echo, stress test and cath results from [facility] of 12/2019 via fax.    No orders for surgery. Have requested x 2.

## 2020-05-13 NOTE — Progress Notes (Signed)
Anesthesia Chart Review   Case: [id number] Date/Time: 05/18/20 0815   Procedures:      DIAGNOSTIC POUCHOSCOPY (N/A )     PLACEMENT OF SETON (N/A )     POSSIBLE INCISION AND DRAINAGE OF PERIANAL ABSCESS, POSSIBLE BIOPSY (N/A )     ANORECTAL EXAM UNDER ANESTHESIA (N/A )   Anesthesia type: Choice   Pre-op diagnosis: transsphincteric anal fistula history of inflammatory bowel disease   [facility] ROOM 04 / [facility]   Surgeons: Ileana Roup, MD      DISCUSSION:74 y.o. former smoker with h/o HTN, CAD (DES to LAD 12/2019), transsphincteric anal fistula scheduled for above procedure 05/18/20 with Dr. Nadeen Landau.   S/p DES to LAD 12/2019. Last seen by cardiology 04/14/20. Per preoperative cardiac assessment pt is low risk for planned surgery, "Pt cannot stop Brilinta/Plavix, DES 12/2019" Message left with triage nurse to inform Dr. Dema Severin of cardiologists recommendation.  VS: BP (!) 159/74   Pulse 63   Temp 36.4 C (Oral)   Resp 16   Ht 5\' 11"  (1.803 m)   Wt 71.7 kg   SpO2 100%   BMI 22.04 kg/m   PROVIDERS: Patient, No Pcp Per  McGukin, Laban Emperor, MD is Cardiologist  LABS: Labs reviewed: Acceptable for surgery. (all labs ordered are listed, but only abnormal results are displayed)  Labs Reviewed  BASIC METABOLIC PANEL - Abnormal; Notable for the following components:      Result Value   Sodium 134 (*)    Glucose, Bld 144 (*)    Creatinine, Ser 1.76 (*)    GFR, Estimated 41 (*)    All other components within normal limits  CBC - Abnormal; Notable for the following components:   RBC 4.08 (*)    Hemoglobin 12.4 (*)    HCT 38.5 (*)    All other components within normal limits     IMAGES:   EKG:   CV: Echo 12/29/19  SUMMARY  LV ejection fraction = 40-45%.  There is apical LV wall hypokinesis  There is trace aortic regurgitation.  There is mild mitral regurgitation.  There is trace tricuspid regurgitation.  No pulmonary hypertension.  There is no  comparison study available.   Past Medical History:  Diagnosis Date  . Chronic back pain   . Coronary artery disease   . History of kidney stones   . Hypertension   . Medical history non-contributory   . Myocardial infarction ([place])    STENTS   . Ulcerative colitis ([place])    hx of    Past Surgical History:  Procedure Laterality Date  . BACK SURGERY    . COLON SURGERY    . CORONARY ANGIOPLASTY    . HERNIA REPAIR    . INSERTION OF MESH N/A 12/01/2012   Procedure: INSERTION OF MESH;  Surgeon: Imogene Burn. Georgette Dover, MD;  Location: [facility];  Service: General;  Laterality: N/A;  . NOSE SURGERY    . VENTRAL HERNIA REPAIR N/A 12/01/2012   Procedure: LAPAROSCOPIC VENTRAL HERNIA;  Surgeon: Imogene Burn. Georgette Dover, MD;  Location: [facility];  Service: General;  Laterality: N/A;    MEDICATIONS: . ascorbic acid (VITAMIN C) 500 MG tablet  . ASPIRIN LOW DOSE 81 MG EC tablet  . atorvastatin (LIPITOR) 80 MG tablet  . B Complex-C (SUPER B COMPLEX PO)  . Cholecalciferol (VITAMIN D) 50 MCG (2000 UT) CAPS  . ciprofloxacin (CIPRO) 500 MG/5ML (10%) suspension  . clopidogrel (PLAVIX) 75 MG tablet  . metoprolol tartrate (  LOPRESSOR) 25 MG tablet  . Omega-3 Fatty Acids (OMEGA 3 500) 500 MG CAPS  . traMADol (ULTRAM) 50 MG tablet  . zinc gluconate 50 MG tablet   No current facility-administered medications for this encounter.    Konrad Felix, PA-C [facility] Pre-Surgical Testing [phone]

## 2020-05-14 ENCOUNTER — Other Ambulatory Visit (HOSPITAL_COMMUNITY)
Admission: RE | Admit: 2020-05-14 | Discharge: 2020-05-14 | Disposition: A | Discharge status: Home / Self Care | Payer: Medicare Other | Source: Ambulatory Visit | Attending: Surgery | Admitting: Surgery

## 2020-05-14 DIAGNOSIS — Z20822 Contact with and (suspected) exposure to covid-19: Secondary | ICD-10-CM | POA: Insufficient documentation

## 2020-05-14 DIAGNOSIS — Z01812 Encounter for preprocedural laboratory examination: Secondary | ICD-10-CM | POA: Insufficient documentation

## 2020-05-15 LAB — SARS CORONAVIRUS 2 (TAT 6-24 HRS): SARS Coronavirus 2: NEGATIVE

## 2020-05-17 NOTE — Anesthesia Preprocedure Evaluation (Addendum)
Anesthesia Evaluation  Patient identified by MRN, date of birth, ID band Patient awake    Reviewed: Allergy & Precautions, NPO status , Patient's Chart, lab work & pertinent test results  Airway Mallampati: I  TM Distance: >3 FB Neck ROM: Full    Dental  (+) Missing, Chipped   Pulmonary former smoker,    Pulmonary exam normal breath sounds clear to auscultation       Cardiovascular hypertension, Pt. on home beta blockers + CAD, + Past MI and + Cardiac Stents  Normal cardiovascular exam Rhythm:Regular Rate:Normal     Neuro/Psych negative neurological ROS  negative psych ROS   GI/Hepatic Neg liver ROS, PUD, Ulcerative colitis   Endo/Other  negative endocrine ROS  Renal/GU Renal InsufficiencyRenal disease     Musculoskeletal negative musculoskeletal ROS (+)   Abdominal   Peds  Hematology  (+) anemia , HLD   Anesthesia Other Findings transsphincteric anal fistula history of inflammatory bowel disease  Reproductive/Obstetrics                           Anesthesia Physical Anesthesia Plan  ASA: III  Anesthesia Plan: General   Post-op Pain Management:    Induction: Intravenous  PONV Risk Score and Plan: 3 and Ondansetron, Dexamethasone and Treatment may vary due to age or medical condition  Airway Management Planned: Oral ETT  Additional Equipment:   Intra-op Plan:   Post-operative Plan: Extubation in OR  Informed Consent: I have reviewed the patients History and Physical, chart, labs and discussed the procedure including the risks, benefits and alternatives for the proposed anesthesia with the patient or authorized representative who has indicated his/her understanding and acceptance.     Dental advisory given  Plan Discussed with: CRNA  Anesthesia Plan Comments:        Anesthesia Quick Evaluation

## 2020-05-18 ENCOUNTER — Ambulatory Visit (HOSPITAL_COMMUNITY): Payer: Medicare Other | Admitting: Physician Assistant

## 2020-05-18 ENCOUNTER — Ambulatory Visit (HOSPITAL_COMMUNITY): Payer: Medicare Other | Admitting: Certified Registered"

## 2020-05-18 ENCOUNTER — Ambulatory Visit (HOSPITAL_COMMUNITY)
Admission: RE | Admit: 2020-05-18 | Discharge: 2020-05-18 | Disposition: A | Discharge status: Home / Self Care | Payer: Medicare Other | Attending: Surgery | Admitting: Surgery

## 2020-05-18 ENCOUNTER — Encounter (HOSPITAL_COMMUNITY): Payer: Self-pay | Admitting: Surgery

## 2020-05-18 ENCOUNTER — Encounter (HOSPITAL_COMMUNITY)
Admission: RE | Disposition: A | Discharge status: Home / Self Care | Payer: Self-pay | Source: Home / Self Care | Attending: Surgery

## 2020-05-18 DIAGNOSIS — Z79899 Other long term (current) drug therapy: Secondary | ICD-10-CM | POA: Insufficient documentation

## 2020-05-18 DIAGNOSIS — E785 Hyperlipidemia, unspecified: Secondary | ICD-10-CM | POA: Insufficient documentation

## 2020-05-18 DIAGNOSIS — Z7982 Long term (current) use of aspirin: Secondary | ICD-10-CM | POA: Diagnosis not present

## 2020-05-18 DIAGNOSIS — I509 Heart failure, unspecified: Secondary | ICD-10-CM | POA: Diagnosis not present

## 2020-05-18 DIAGNOSIS — Z955 Presence of coronary angioplasty implant and graft: Secondary | ICD-10-CM | POA: Insufficient documentation

## 2020-05-18 DIAGNOSIS — K603 Anal fistula: Secondary | ICD-10-CM | POA: Diagnosis not present

## 2020-05-18 DIAGNOSIS — I2510 Atherosclerotic heart disease of native coronary artery without angina pectoris: Secondary | ICD-10-CM | POA: Insufficient documentation

## 2020-05-18 DIAGNOSIS — Z9049 Acquired absence of other specified parts of digestive tract: Secondary | ICD-10-CM | POA: Diagnosis not present

## 2020-05-18 DIAGNOSIS — K610 Anal abscess: Secondary | ICD-10-CM | POA: Diagnosis not present

## 2020-05-18 DIAGNOSIS — Z9889 Other specified postprocedural states: Secondary | ICD-10-CM | POA: Diagnosis not present

## 2020-05-18 DIAGNOSIS — K91850 Pouchitis: Secondary | ICD-10-CM | POA: Diagnosis not present

## 2020-05-18 DIAGNOSIS — I252 Old myocardial infarction: Secondary | ICD-10-CM | POA: Insufficient documentation

## 2020-05-18 DIAGNOSIS — I110 Hypertensive heart disease with heart failure: Secondary | ICD-10-CM | POA: Insufficient documentation

## 2020-05-18 DIAGNOSIS — Z8719 Personal history of other diseases of the digestive system: Secondary | ICD-10-CM | POA: Diagnosis not present

## 2020-05-18 DIAGNOSIS — K529 Noninfective gastroenteritis and colitis, unspecified: Secondary | ICD-10-CM | POA: Insufficient documentation

## 2020-05-18 DIAGNOSIS — I10 Essential (primary) hypertension: Secondary | ICD-10-CM | POA: Diagnosis not present

## 2020-05-18 DIAGNOSIS — K633 Ulcer of intestine: Secondary | ICD-10-CM | POA: Diagnosis not present

## 2020-05-18 DIAGNOSIS — Z7902 Long term (current) use of antithrombotics/antiplatelets: Secondary | ICD-10-CM | POA: Diagnosis not present

## 2020-05-18 DIAGNOSIS — K6289 Other specified diseases of anus and rectum: Secondary | ICD-10-CM | POA: Diagnosis not present

## 2020-05-18 HISTORY — PX: POUCHOSCOPY: SHX6321

## 2020-05-18 HISTORY — PX: RECTAL EXAM UNDER ANESTHESIA: SHX6399

## 2020-05-18 HISTORY — PX: PLACEMENT OF SETON: SHX6029

## 2020-05-18 HISTORY — PX: INCISION AND DRAINAGE ABSCESS: SHX5864

## 2020-05-18 SURGERY — ENDOSCOPY, POUCH, SMALL INTESTINE, DIAGNOSTIC
Anesthesia: General | Site: Rectum

## 2020-05-18 MED ORDER — ACETAMINOPHEN 500 MG PO TABS
1000.0000 mg | ORAL_TABLET | Freq: Once | ORAL | Status: AC
  Administered 2020-05-18: 1000 mg via ORAL
  Filled 2020-05-18: qty 2

## 2020-05-18 MED ORDER — ONDANSETRON HCL 4 MG/2ML IJ SOLN: 4.0000 mg | Freq: Once | INTRAMUSCULAR | Status: DC | PRN

## 2020-05-18 MED ORDER — FENTANYL CITRATE (PF) 250 MCG/5ML IJ SOLN
INTRAMUSCULAR | Status: AC
  Filled 2020-05-18: qty 5

## 2020-05-18 MED ORDER — ONDANSETRON HCL 4 MG/2ML IJ SOLN
INTRAMUSCULAR | Status: DC | PRN
  Administered 2020-05-18: 4 mg via INTRAVENOUS

## 2020-05-18 MED ORDER — DEXAMETHASONE SODIUM PHOSPHATE 10 MG/ML IJ SOLN
INTRAMUSCULAR | Status: DC | PRN
  Administered 2020-05-18: 4 mg via INTRAVENOUS

## 2020-05-18 MED ORDER — PROPOFOL 10 MG/ML IV BOLUS
INTRAVENOUS | Status: AC
  Filled 2020-05-18: qty 40

## 2020-05-18 MED ORDER — 0.9 % SODIUM CHLORIDE (POUR BTL) OPTIME
TOPICAL | Status: DC | PRN
  Administered 2020-05-18: 1000 mL

## 2020-05-18 MED ORDER — EPHEDRINE SULFATE-NACL 50-0.9 MG/10ML-% IV SOSY
PREFILLED_SYRINGE | INTRAVENOUS | Status: DC | PRN
  Administered 2020-05-18 (×3): 5 mg via INTRAVENOUS

## 2020-05-18 MED ORDER — SUGAMMADEX SODIUM 200 MG/2ML IV SOLN
INTRAVENOUS | Status: DC | PRN
  Administered 2020-05-18: 150 mg via INTRAVENOUS

## 2020-05-18 MED ORDER — BUPIVACAINE-EPINEPHRINE 0.5% -1:200000 IJ SOLN
INTRAMUSCULAR | Status: DC | PRN
  Administered 2020-05-18: 15 mL

## 2020-05-18 MED ORDER — CEFAZOLIN SODIUM-DEXTROSE 2-4 GM/100ML-% IV SOLN
INTRAVENOUS | Status: AC
  Filled 2020-05-18: qty 100

## 2020-05-18 MED ORDER — FENTANYL CITRATE (PF) 100 MCG/2ML IJ SOLN: 25.0000 ug | INTRAMUSCULAR | Status: DC | PRN

## 2020-05-18 MED ORDER — PHENYLEPHRINE HCL (PRESSORS) 10 MG/ML IV SOLN
INTRAVENOUS | Status: AC
  Filled 2020-05-18: qty 1

## 2020-05-18 MED ORDER — PHENYLEPHRINE 40 MCG/ML (10ML) SYRINGE FOR IV PUSH (FOR BLOOD PRESSURE SUPPORT)
PREFILLED_SYRINGE | INTRAVENOUS | Status: DC | PRN
  Administered 2020-05-18 (×6): 40 ug via INTRAVENOUS

## 2020-05-18 MED ORDER — FENTANYL CITRATE (PF) 250 MCG/5ML IJ SOLN
INTRAMUSCULAR | Status: DC | PRN
  Administered 2020-05-18: 100 ug via INTRAVENOUS
  Administered 2020-05-18: 50 ug via INTRAVENOUS

## 2020-05-18 MED ORDER — ROCURONIUM BROMIDE 10 MG/ML (PF) SYRINGE
PREFILLED_SYRINGE | INTRAVENOUS | Status: DC | PRN
  Administered 2020-05-18: 70 mg via INTRAVENOUS

## 2020-05-18 MED ORDER — ROCURONIUM BROMIDE 10 MG/ML (PF) SYRINGE
PREFILLED_SYRINGE | INTRAVENOUS | Status: AC
  Filled 2020-05-18: qty 10

## 2020-05-18 MED ORDER — LIDOCAINE 2% (20 MG/ML) 5 ML SYRINGE
INTRAMUSCULAR | Status: DC | PRN
  Administered 2020-05-18: 40 mg via INTRAVENOUS

## 2020-05-18 MED ORDER — LACTATED RINGERS IV SOLN: INTRAVENOUS | Status: DC

## 2020-05-18 MED ORDER — ORAL CARE MOUTH RINSE: 15.0000 mL | Freq: Once | OROMUCOSAL | Status: AC

## 2020-05-18 MED ORDER — BUPIVACAINE-EPINEPHRINE (PF) 0.5% -1:200000 IJ SOLN
INTRAMUSCULAR | Status: AC
  Filled 2020-05-18: qty 30

## 2020-05-18 MED ORDER — TRAMADOL HCL 50 MG PO TABS
50.0000 mg | ORAL_TABLET | Freq: Four times a day (QID) | ORAL | 0 refills | Status: AC | PRN
Start: 2020-05-18 — End: 2020-05-23

## 2020-05-18 MED ORDER — ONDANSETRON HCL 4 MG/2ML IJ SOLN
INTRAMUSCULAR | Status: AC
  Filled 2020-05-18: qty 2

## 2020-05-18 MED ORDER — DIBUCAINE (PERIANAL) 1 % EX OINT
TOPICAL_OINTMENT | CUTANEOUS | Status: AC
  Filled 2020-05-18: qty 28

## 2020-05-18 MED ORDER — PROPOFOL 10 MG/ML IV BOLUS
INTRAVENOUS | Status: DC | PRN
  Administered 2020-05-18: 120 mg via INTRAVENOUS

## 2020-05-18 MED ORDER — CHLORHEXIDINE GLUCONATE 0.12 % MT SOLN
15.0000 mL | Freq: Once | OROMUCOSAL | Status: AC
  Administered 2020-05-18: 15 mL via OROMUCOSAL

## 2020-05-18 MED ORDER — DEXAMETHASONE SODIUM PHOSPHATE 10 MG/ML IJ SOLN
INTRAMUSCULAR | Status: AC
  Filled 2020-05-18: qty 1

## 2020-05-18 SURGICAL SUPPLY — 34 items
BENZOIN TINCTURE PRP APPL 2/3 (GAUZE/BANDAGES/DRESSINGS) ×3 IMPLANT
BLADE SURG 15 STRL LF DISP TIS (BLADE) IMPLANT
BLADE SURG 15 STRL SS (BLADE)
BRIEF STRETCH FOR OB PAD LRG (UNDERPADS AND DIAPERS) ×3 IMPLANT
CNTNR URN SCR LID CUP LEK RST (MISCELLANEOUS) ×2 IMPLANT
CONT SPEC 4OZ STRL OR WHT (MISCELLANEOUS) ×1
COVER SURGICAL LIGHT HANDLE (MISCELLANEOUS) ×3 IMPLANT
COVER WAND RF STERILE (DRAPES) IMPLANT
DECANTER SPIKE VIAL GLASS SM (MISCELLANEOUS) ×3 IMPLANT
DRAPE LAPAROTOMY T 102X78X121 (DRAPES) ×3 IMPLANT
DRSG PAD ABDOMINAL 8X10 ST (GAUZE/BANDAGES/DRESSINGS) ×3 IMPLANT
ELECT REM PT RETURN 15FT ADLT (MISCELLANEOUS) ×3 IMPLANT
GAUZE 4X4 16PLY RFD (DISPOSABLE) ×3 IMPLANT
GAUZE SPONGE 4X4 12PLY STRL (GAUZE/BANDAGES/DRESSINGS) ×3 IMPLANT
GLOVE BIO SURGEON STRL SZ7.5 (GLOVE) ×3 IMPLANT
GLOVE INDICATOR 8.0 STRL GRN (GLOVE) ×3 IMPLANT
GOWN STRL REUS W/TWL XL LVL3 (GOWN DISPOSABLE) ×6 IMPLANT
KIT BASIN OR (CUSTOM PROCEDURE TRAY) ×3 IMPLANT
KIT TURNOVER KIT A (KITS) IMPLANT
LOOP VESSEL MAXI BLUE (MISCELLANEOUS) IMPLANT
NEEDLE HYPO 22GX1.5 SAFETY (NEEDLE) ×3 IMPLANT
PACK BASIC VI WITH GOWN DISP (CUSTOM PROCEDURE TRAY) ×3 IMPLANT
PENCIL SMOKE EVACUATOR (MISCELLANEOUS) IMPLANT
SHEARS HARMONIC 9CM CVD (BLADE) IMPLANT
SURGILUBE 2OZ TUBE FLIPTOP (MISCELLANEOUS) ×3 IMPLANT
SUT CHROMIC 2 0 SH (SUTURE) ×3 IMPLANT
SUT CHROMIC 3 0 SH 27 (SUTURE) IMPLANT
SUT VIC AB 2-0 SH 27 (SUTURE)
SUT VIC AB 2-0 SH 27X BRD (SUTURE) IMPLANT
SUT VIC AB 2-0 UR6 27 (SUTURE) ×18 IMPLANT
SYR 20ML LL LF (SYRINGE) ×3 IMPLANT
SYR 3ML LL SCALE MARK (SYRINGE) IMPLANT
TOWEL OR 17X26 10 PK STRL BLUE (TOWEL DISPOSABLE) ×3 IMPLANT
TOWEL OR NON WOVEN STRL DISP B (DISPOSABLE) ×3 IMPLANT

## 2020-05-18 NOTE — Op Note (Signed)
05/18/2020  9:54 AM  PATIENT:  Eric Giles  74 y.o. male  Patient Care Team: Patient, No Pcp Per as PCP - General (General Practice)  PRE-OPERATIVE DIAGNOSIS: History of J-pouch; anal fistula; probable Crohn's disease  POST-OPERATIVE DIAGNOSIS:  Same  PROCEDURE:   1. Diagnostic pouchoscopy with biopsy 2. Incision and drainage of perianal abscess 3. Placement of draining setons x2  SURGEON:  Surgeon(s): Ileana Roup, MD  ASSISTANT: OR staff   ANESTHESIA:   local and general  SPECIMEN:   1. Ileal J pouch biopsies 2. Anal transition zone biopsy  DISPOSITION OF SPECIMEN:  PATHOLOGY  COUNTS:  Sponge, needle, and instrument counts were reported correct x2 at conclusion.  EBL: 3 mL  Drains: Draining setons x2  PLAN OF CARE: Discharge to home after PACU  PATIENT DISPOSITION:  PACU - hemodynamically stable.  OR FINDINGS: Granular inflammation of the J pouch with cobblestoning of the ileal mucosa and some pseudopolyps - most prominent in the distal pouch; significant inflammation and friable mucosa extending down to the anal transition zone. All of this suspicious in appearance for Crohn's disease, particularly given anorectal findings. Biopsies taken. Trans-sphincteric anal fistula + abscess in perianal space. External opening left lateral; internal opening just to the left of posterior midline.  DESCRIPTION: The patient was identified in the preoperative holding area and taken to the OR. SCDs were applied. He then underwent general endotracheal anesthesia without difficulty. The patient was then rolled onto the OR table in the prone jackknife position. Pressure points were then evaluated and padded. Benzoin was applied to the buttocks and they were gently taped apart.  He was then prepped and draped in usual sterile fashion.  A surgical timeout was performed indicating the correct patient, procedure, and positioning.  A perianal block was then created using a dilute  mixture of 0.5% Marcaine with epinephrine.  After ascertaining an appropriate level of anesthesia had been achieved, a well lubricated digital rectal exam was performed. This demonstrated a patent ileal pouch anastomosis with some edematous narrowing but not frank stenosis. No palpable masses.  A Hill-Ferguson anoscope was into the anal canal and circumferential inspection demonstrated granular inflammation of the ATZ. Externally, an external opening is present in the left lateral position. A fistula probe was passed without difficulty and an internal opening found just to the left of the posterior midline. This was then controlled with a blue vessel loop seton and secured to itself with 0 Ethibond sutures.  Externally, extending away from the external opening there was an abscess cavity that communicated with this external opening draining purulent fluid from a 2nd opening. This was incised and drained. This was landscaped to facilitate drainage and then controlled with a 2nd blue vessel loop seton. Secured to itself with 0 Ethibond sutures. Hemostasis achieved with electrocautery.  Attention turned then to pouchoscopy. The flexible EGD scope was introduced through the anus and into the J pouch. Findings as noted above. The neoterminal ileum was intubated and relatively normal in appearance. The J tip was also intubated and relatively normal in appearance. The bulk of the inflammation was in the pouch, particularly extending into the distal pouch. Biopsies were taken including of a pseudopolyp. The pseudopolyp was oozing and a small amount of cautery applied with the forceps to ensure hemostasis. Biopsies were then submitted separately of the anal transition zone. The pouch inspected and hemostatic. The scope was then removed. Of note, due to this being performed in the OR, images of the scope  were not able to be saved due to equipment issues and not being synced with [employer].  A dressing was placed over  the perianal area consisting of dibucaine, 4x4s and ABD pad. He was then rolled onto a stretcher, awakened from anesthesia, extubated and transported to PACU in satisfactory condition.  DISPOSITION: PACU in satisfactory condition.

## 2020-05-18 NOTE — H&P (Addendum)
CC: Referred by Dr. Lilia Pro for possible anal fistula in setting of J pouch  HPI: [name] is a very pleasant 71yoM with history of ulcerative colitis, CAD (s/p stents, on Brilenta), HTN, HLD who underwent a two-stage J-pouch operation with Dr. Harlon Ditty 25 years ago at [facility]. He reports that he has had reasonably good pouch function since his surgery. He does report that he has been taking suppressive ciprofloxacin for at least a decade or more control "the bacteria in his small bowel." Beginning in September 2021, he experienced some left-sided perianal discomfort and pain. He reports that he had an abscess that was drained at that time. He has had ongoing drainage since. He saw one of my partners out in [place]-Dr. Ralph Dowdy prescribed him cefuroxime and Bactrim. Since taking it, he reports significant improvement in all of his symptoms. He reports that whenever his abscess/perianal pain would flare, he experienced 20-25 bowel movements per day. Since being on the Bactrim he notes that his bowel movements have only occurred once or twice overnight in 3 or 4 times during the day. This is near his normal pouch function. Currently, denies fever/chills/nausea/vomiting. He denies any abdominal pain. He denies any history of bowel obstructions.  MRI in [place] demonstrated by report a transsphincteric anal fistula and some 'dilation' proximal to anus - unclear if this may indicate  Of note, he had ACS months ago and had a stent placed. He is currently on antiplatelet agent.   PMH: Presumed UC - now s/p rPC with IPAA - Dr. Launa Flight, [facility]; CAD/MI (s/p PCI, on Brilenta); HTN; HLD  PSH: 2 stage J pouch  FHx: Denies FHx of colorectal, breast, endometrial, ovarian or cervical cancer  Social: Denies use of tobacco/EtOH/drugs. He is happily retired and lives down in [place]: A comprehensive 10 system review of systems was completed with the patient and pertinent findings as noted  above.  The patient is a 74 year old male.   Past Surgical History Antonietta Jewel, [place]; 04/19/2020 8:48 AM) Colon Removal - Complete  Laparoscopic Inguinal Hernia Surgery  multiple Spinal Surgery - Lower Back   Diagnostic Studies History [facility] Teressa Senter, CMA; 04/19/2020 8:48 AM) Colonoscopy  >10 years ago  Allergies Emmaline Kluver Teressa Senter, CMA; 04/19/2020 8:48 AM) No Known Drug Allergies  [04/19/2020]: Allergies Reconciled   Medication History (Chanel Teressa Senter, CMA; 04/19/2020 8:49 AM) Aspirin EC Low Dose (81MG  Tablet DR, Oral) Active. Atorvastatin Calcium (80MG  Tablet, Oral) Active. Brilinta (90MG  Tablet, Oral) Active. Meloxicam (7.5MG  Tablet, Oral) Active. Metoprolol Tartrate (25MG  Tablet, Oral) Active. B Complex B-12 (Oral) Active. Fish Oil (Oral) Specific strength unknown - Active. Vitamin C (Oral) Specific strength unknown - Active. Vitamin D3 (Oral) Specific strength unknown - Active. Zinc (Oral) Specific strength unknown - Active. Medications Reconciled  Social History Antonietta Jewel, CMA; 04/19/2020 8:48 AM) Alcohol use  Remotely quit alcohol use. No drug use   Other Problems (Chanel Teressa Senter, CMA; 04/19/2020 8:48 AM) Arthritis  Back Pain  Congestive Heart Failure  Inguinal Hernia  Kidney Stone  Myocardial infarction  Ulcerative Colitis     Review of Systems (Chanel Nolan CMA; 04/19/2020 8:48 AM) General Present- Chills and Fatigue. Not Present- Appetite Loss, Fever, Night Sweats, Weight Gain and Weight Loss. Skin Present- Dryness and Non-Healing Wounds. Not Present- Change in Wart/Mole, Hives, Jaundice, New Lesions, Rash and Ulcer. HEENT Present- Seasonal Allergies and Wears glasses/contact lenses. Not Present- Earache, Hearing Loss, Hoarseness, Nose Bleed, Oral Ulcers, Ringing in the Ears, Sinus Pain, Sore Throat, Visual Disturbances and  Yellow Eyes. Respiratory Not Present- Bloody sputum, Chronic Cough, Difficulty Breathing, Snoring and  Wheezing. Cardiovascular Not Present- Chest Pain, Difficulty Breathing Lying Down, Leg Cramps, Palpitations, Rapid Heart Rate, Shortness of Breath and Swelling of Extremities. Male Genitourinary Not Present- Blood in Urine, Change in Urinary Stream, Frequency, Impotence, Nocturia, Painful Urination, Urgency and Urine Leakage. Musculoskeletal Present- Muscle Weakness. Not Present- Back Pain, Joint Pain, Joint Stiffness, Muscle Pain and Swelling of Extremities. Neurological Not Present- Decreased Memory, Fainting, Headaches, Numbness, Seizures, Tingling, Tremor, Trouble walking and Weakness. Hematology Present- Blood Thinners, Easy Bruising and Excessive bleeding. Not Present- Gland problems, HIV and Persistent Infections.  Vitals (Chanel Nolan CMA; 04/19/2020 8:49 AM) 04/19/2020 8:49 AM Weight: 158.13 lb Height: 71in Body Surface Area: 1.91 m Body Mass Index: 22.05 kg/m  Temp.: 97.61F  Pulse: 67 (Regular)  BP: 126/74(Sitting, Left Arm, Standard)       Physical Exam [name] MD; 04/19/2020 9:08 AM) The physical exam findings are as follows: Note: Constitutional: No acute distress; conversant; no deformities Eyes: Moist conjunctiva; no lid lag; anicteric sclerae; pupils equal and round Neck: Trachea midline; no palpable thyromegaly Lungs: Normal respiratory effort; no tactile fremitus CV: rrr; no palpable thrill; no pitting edema GI: Abdomen soft, nontender, nondistended; no palpable hepatosplenomegaly Anorectal: Left lateral anal margin granulation tissue consistent with external opening of anal fistula. Associated abscess cavity but decompressed. No fluctuance. DRE - somewhat difficult to examine due to intolerance. Proable internal opening posterior midline Anoscopy: Circumferential anoscopy limited due to intolerance. There is suggestion of a posterior midline internal opening as I was able to see some granulation tissue versus a fissure. MSK: Normal gait; no  clubbing/cyanosis Psychiatric: Appropriate affect; alert and oriented 3 Lymphatic: No palpable cervical or axillary lymphadenopathy **A chaperone, [employer], was present for this encounter    Assessment & Plan Harrell Gave M. Yeraldin Litzenberger MD; 04/19/2020 9:34 AM) ANAL FISTULA (K60.3) Story: Mr. Matusek is a very pleasant 81yoM with hx of HTN, HLD, CAD (on Brilenta), presumed UC, ileal J pouch 25 years ago, now with probable anal fistula  MRI shows probable transsphincteric anal fistula Impression: -Cardiac clearance -In the setting of IBD, we discussed the potential for a more Crohn's phenotype picture here. We discussed options going forward. He is interested in keeping his care local if he can. We discussed anorectal examination anesthesia with placement of seton, possible incision and drainage of abscess of present, concurrent pouchoscopy with possible biopsies to evaluate for Crohn's disease. -The anatomy and physiology of the anal canal was discussed at length with the patient. The pathophysiology of anal abscess, fistula and Crohn's was discussed at length with associated pictures and illustrations. -The planned procedures, material risks (including, but not limited to, pain, bleeding, infection, scarring, need for blood transfusion, damage to anal sphincter, incontinence of gas and/or stool, need for additional procedures, recurrence, pneumonia, heart attack, stroke, death) benefits and alternatives to surgery were discussed at length. I noted a good probability that the procedure would help improve their symptoms. The patient's questions were answered to their satisfaction, they voiced understanding and elected to proceed with surgery. Additionally, we discussed typical postoperative expectations and the recovery process. We discussed that this procedure would not be "curative" of his anal fistula but would ideally decrease the probability of recurrent abscesses. We also discussed it as a  diagnostic procedure to evaluate for Crohn's. We discussed if he were to have Crohn's dead suspicious he does, potential for ongoing medical management and GI evaluation for anti-TNF therapy/Remicade.  This patient encounter took 45 minutes today to perform the following: take history, perform exam, review outside records, interpret imaging, counsel the patient on their diagnosis and document encounter, findings & plan in the EHR Current Plans ANOSCOPY, DIAGNOSTIC (02217) (Anoscopy: Circumferential anoscopy limited due to intolerance. There is suggestion of a posterior midline internal opening as I was able to see some granulation tissue versus a fissure.) HISTORY OF ULCERATIVE COLITIS (Z87.19)  Signed by Ileana Roup, MD (04/19/2020 9:34 AM)  Jones Skene We reviewed his case with his cardiologist. He requested he remain on plavix if at all possible given timing from his stent. I discussed this with the patient. He is concerned about recurrent abscesses in the interim, which I agree is a significant concern. If we were to wait and a recurrent abscess occurred, would require potentially longer cessation of plavix. We discussed bleeding risk with placement of setons/pouchoscopy as being low but possible. He expressed understanding and has requested to proceed.  We will plan to do diagnostic pouchoscopy, EUA/placement of setons. The procedure, material risks, benefits and alternatives have been reviewed and he has elected to proceed.  [facility]

## 2020-05-18 NOTE — Anesthesia Procedure Notes (Signed)
Procedure Name: Intubation Date/Time: 05/18/2020 8:42 AM Performed by: Eben Burow, CRNA Pre-anesthesia Checklist: Patient identified, Emergency Drugs available, Suction available, Patient being monitored and Timeout performed Patient Re-evaluated:Patient Re-evaluated prior to induction Oxygen Delivery Method: Circle system utilized Preoxygenation: Pre-oxygenation with 100% oxygen Induction Type: IV induction Ventilation: Mask ventilation without difficulty Laryngoscope Size: Mac and 4 Grade View: Grade I Tube type: Oral Number of attempts: 1 Airway Equipment and Method: Stylet Placement Confirmation: ETT inserted through vocal cords under direct vision,  positive ETCO2 and breath sounds checked- equal and bilateral Secured at: 22 cm Tube secured with: Tape Dental Injury: Teeth and Oropharynx as per pre-operative assessment

## 2020-05-18 NOTE — Discharge Instructions (Addendum)
ANORECTAL SURGERY: POST OP INSTRUCTIONS  1. DIET: Follow a light bland diet the first 24 hours after arrival home, such as soup, liquids, crackers, etc.  Be sure to include lots of fluids daily.  Avoid fast food or heavy meals as your are more likely to get nauseated.  Eat a low fat diet the next few days after surgery.   2. Some bleeding with bowel movements is expected for the first couple of days but this should stop in between bowel movements  3. Take your usually prescribed home medications unless otherwise directed.  4. PAIN CONTROL: a. It is helpful to take an over-the-counter pain medication regularly for the first few days/weeks.  Choose from the following that works best for you: i. Ibuprofen (Advil, etc) Three 23m tabs every 6 hours as needed. ii. Acetaminophen (Tylenol, etc) 500-6570mevery 6 hours as needed iii. NOTE: You may take both of these medications together - most patients find it most helpful when alternating between the two (i.e. Ibuprofen at 6am, tylenol at 9am, ibuprofen at 12pm ...) b. A  prescription for pain medication may have been prescribed for you at discharge.  Take your pain medication as prescribed.  i. If you are having problems/concerns with the prescription medicine, please call us[place]oreaor further advice.  5. Avoid getting constipated.  Between the surgery and the pain medications, it is common to experience some constipation.  Increasing fluid intake (64oz of water per day) and taking a fiber supplement (such as Metamucil, Citrucel, FiberCon) 1-2 times a day regularly will usually help prevent this problem from occurring.  Take Miralax (over the counter) 1-2x/day while taking a narcotic pain medication. If no bowel movement after 48hours, you may additionally take a laxative like a bottle of Milk of Magnesia which can be purchased over the counter. Avoid enemas if possible as these are often painful.   6. Watch out for diarrhea.  If you have many loose bowel  movements, simplify your diet to bland foods.  Stop any stool softeners and decrease your fiber supplement. If this worsens or does not improve, please call us[place]orea 7. Wash / shower every day.  If you were discharged with a dressing, you may remove this the day after your surgery. You may shower normally, getting soap/water on your wound, particularly after bowel movements.  8. Soaking in a warm bath filled a couple inches ("Sitz bath") is a great way to clean the area after a bowel movement and many patients find it is a way to soothe the area.  9. ACTIVITIES as tolerated:   a. You may resume regular (light) daily activities beginning the next day--such as daily self-care, walking, climbing stairs--gradually increasing activities as tolerated.  If you can walk 30 minutes without difficulty, it is safe to try more intense activity such as jogging, treadmill, bicycling, low-impact aerobics, etc. b. Refrain from any heavy lifting or straining for the first 2 weeks after your procedure, particularly if your surgery was for hemorrhoids. c. Avoid activities that make your pain worse d. You may drive when you are no longer taking prescription pain medication, you can comfortably wear a seatbelt, and you can safely maneuver your car and apply brakes.  10. FOLLOW UP in our office a. Please call [facility] at [phone] to set up an appointment to see your surgeon in the office for a follow-up appointment approximately 2 weeks after your surgery. b. Make sure that you call for this appointment the day you arrive  home to insure a convenient appointment time.  9. If you have disability or family leave forms that need to be completed, you may have them completed by your primary care physician's office; for return to work instructions, please ask our office staff and they will be happy to assist you in obtaining this documentation   When to call us [phone]: 1. Poor pain control 2. Reactions / problems with  new medications (rash/itching, etc)  3. Fever over 101.5 F (38.5 C) 4. Inability to urinate 5. Nausea/vomiting 6. Worsening swelling or bruising 7. Continued bleeding from incision. 8. Increased pain, redness, or drainage from the incision  The clinic staff is available to answer your questions during regular business hours (8:30am-5pm).  Please don't hesitate to call and ask to speak to one of our nurses for clinical concerns.   A surgeon from [facility] is always on call at the hospitals   If you have a medical emergency, go to the nearest emergency room or call 911.   [facility], [place], [address], [place], [place]  [address] ? MAIN: [phone] FAX [phone] [email or web address]     General Anesthesia, Adult, Care After This sheet gives you information about how to care for yourself after your procedure. Your health care provider may also give you more specific instructions. If you have problems or questions, contact your health care provider. What can I expect after the procedure? After the procedure, the following side effects are common:  Pain or discomfort at the IV site.  Nausea.  Vomiting.  Sore throat.  Trouble concentrating.  Feeling cold or chills.  Weak or tired.  Sleepiness and fatigue.  Soreness and body aches. These side effects can affect parts of the body that were not involved in surgery. Follow these instructions at home:  For at least 24 hours after the procedure:  Have a responsible adult stay with you. It is important to have someone help care for you until you are awake and alert.  Rest as needed.  Do not: ? Participate in activities in which you could fall or become injured. ? Drive. ? Use heavy machinery. ? Drink alcohol. ? Take sleeping pills or medicines that cause drowsiness. ? Make important decisions or sign legal documents. ? Take care of children on your  own. Eating and drinking  Follow any instructions from your health care provider about eating or drinking restrictions.  When you feel hungry, start by eating small amounts of foods that are soft and easy to digest (bland), such as toast. Gradually return to your regular diet.  Drink enough fluid to keep your urine pale yellow.  If you vomit, rehydrate by drinking water, juice, or clear broth. General instructions  If you have sleep apnea, surgery and certain medicines can increase your risk for breathing problems. Follow instructions from your health care provider about wearing your sleep device: ? Anytime you are sleeping, including during daytime naps. ? While taking prescription pain medicines, sleeping medicines, or medicines that make you drowsy.  Return to your normal activities as told by your health care provider. Ask your health care provider what activities are safe for you.  Take over-the-counter and prescription medicines only as told by your health care provider.  If you smoke, do not smoke without supervision.  Keep all follow-up visits as told by your health care provider. This is important. Contact a health care provider if:  You have nausea or vomiting that  does not get better with medicine.  You cannot eat or drink without vomiting.  You have pain that does not get better with medicine.  You are unable to pass urine.  You develop a skin rash.  You have a fever.  You have redness around your IV site that gets worse. Get help right away if:  You have difficulty breathing.  You have chest pain.  You have blood in your urine or stool, or you vomit blood. Summary  After the procedure, it is common to have a sore throat or nausea. It is also common to feel tired.  Have a responsible adult stay with you for the first 24 hours after general anesthesia. It is important to have someone help care for you until you are awake and alert.  When you feel hungry,  start by eating small amounts of foods that are soft and easy to digest (bland), such as toast. Gradually return to your regular diet.  Drink enough fluid to keep your urine pale yellow.  Return to your normal activities as told by your health care provider. Ask your health care provider what activities are safe for you. This information is not intended to replace advice given to you by your health care provider. Make sure you discuss any questions you have with your health care provider. Document Revised: 05/31/2017 Document Reviewed: 01/11/2017 Elsevier Patient Education  [place].

## 2020-05-18 NOTE — Anesthesia Postprocedure Evaluation (Signed)
Anesthesia Post Note  Patient: Eric Giles  Procedure(s) Performed: DIAGNOSTIC POUCHOSCOPY WITH BIOPSY (N/A Rectum) PLACEMENT OF SETON (N/A Rectum) POSSIBLE INCISION AND DRAINAGE OF PERIANAL ABSCESS (N/A Rectum) ANORECTAL EXAM UNDER ANESTHESIA (N/A )     Patient location during evaluation: PACU Anesthesia Type: General Level of consciousness: awake Pain management: pain level controlled Vital Signs Assessment: post-procedure vital signs reviewed and stable Respiratory status: spontaneous breathing, nonlabored ventilation, respiratory function stable and patient connected to nasal cannula oxygen Cardiovascular status: blood pressure returned to baseline and stable Postop Assessment: no apparent nausea or vomiting Anesthetic complications: no   No complications documented.  Last Vitals:  Vitals:   05/18/20 1042 05/18/20 1124  BP: 132/70 137/72  Pulse: 60 60  Resp: 18   Temp: 36.4 C   SpO2: 96% 100%    Last Pain:  Vitals:   05/18/20 1124  TempSrc:   PainSc: 2                  Joyia Riehle P Rechelle Niebla

## 2020-05-18 NOTE — Transfer of Care (Signed)
Immediate Anesthesia Transfer of Care Note  Patient: Eric Giles  Procedure(s) Performed: DIAGNOSTIC POUCHOSCOPY WITH BIOPSY (N/A Rectum) PLACEMENT OF SETON (N/A Rectum) POSSIBLE INCISION AND DRAINAGE OF PERIANAL ABSCESS (N/A Rectum) ANORECTAL EXAM UNDER ANESTHESIA (N/A )  Patient Location: PACU  Anesthesia Type:General  Level of Consciousness: awake, alert  and patient cooperative  Airway & Oxygen Therapy: Patient Spontanous Breathing and Patient connected to face mask oxygen  Post-op Assessment: Report given to RN and Post -op Vital signs reviewed and stable  Post vital signs: Reviewed and stable  Last Vitals:  Vitals Value Taken Time  BP 124/68 05/18/20 1002  Temp 36.4 C 05/18/20 1002  Pulse 75 05/18/20 1002  Resp 17 05/18/20 1002  SpO2 100 % 05/18/20 1002  Vitals shown include unvalidated device data.  Last Pain:  Vitals:   05/18/20 0714  TempSrc: Oral         Complications: No complications documented.

## 2020-05-19 ENCOUNTER — Encounter (HOSPITAL_COMMUNITY): Payer: Self-pay | Admitting: Surgery

## 2020-05-23 LAB — SURGICAL PATHOLOGY

## 2020-06-06 DIAGNOSIS — K50113 Crohn's disease of large intestine with fistula: Secondary | ICD-10-CM | POA: Diagnosis not present

## 2020-06-20 DIAGNOSIS — I2510 Atherosclerotic heart disease of native coronary artery without angina pectoris: Secondary | ICD-10-CM | POA: Diagnosis not present

## 2020-06-20 DIAGNOSIS — I255 Ischemic cardiomyopathy: Secondary | ICD-10-CM | POA: Diagnosis not present

## 2020-07-06 DIAGNOSIS — K91850 Pouchitis: Secondary | ICD-10-CM | POA: Diagnosis not present

## 2020-07-06 DIAGNOSIS — K50113 Crohn's disease of large intestine with fistula: Secondary | ICD-10-CM | POA: Diagnosis not present

## 2020-07-13 DIAGNOSIS — K91850 Pouchitis: Secondary | ICD-10-CM | POA: Diagnosis not present

## 2020-07-13 DIAGNOSIS — I2510 Atherosclerotic heart disease of native coronary artery without angina pectoris: Secondary | ICD-10-CM | POA: Diagnosis not present

## 2020-07-13 DIAGNOSIS — Z9049 Acquired absence of other specified parts of digestive tract: Secondary | ICD-10-CM | POA: Diagnosis not present

## 2020-07-13 DIAGNOSIS — Z7982 Long term (current) use of aspirin: Secondary | ICD-10-CM | POA: Diagnosis not present

## 2020-07-13 DIAGNOSIS — M1990 Unspecified osteoarthritis, unspecified site: Secondary | ICD-10-CM | POA: Diagnosis not present

## 2020-07-13 DIAGNOSIS — K633 Ulcer of intestine: Secondary | ICD-10-CM | POA: Diagnosis not present

## 2020-07-13 DIAGNOSIS — K6289 Other specified diseases of anus and rectum: Secondary | ICD-10-CM | POA: Diagnosis not present

## 2020-07-13 DIAGNOSIS — Z7902 Long term (current) use of antithrombotics/antiplatelets: Secondary | ICD-10-CM | POA: Diagnosis not present

## 2020-07-13 DIAGNOSIS — Z955 Presence of coronary angioplasty implant and graft: Secondary | ICD-10-CM | POA: Diagnosis not present

## 2020-07-13 DIAGNOSIS — Z980 Intestinal bypass and anastomosis status: Secondary | ICD-10-CM | POA: Diagnosis not present

## 2020-07-13 DIAGNOSIS — M549 Dorsalgia, unspecified: Secondary | ICD-10-CM | POA: Diagnosis not present

## 2020-07-13 DIAGNOSIS — I252 Old myocardial infarction: Secondary | ICD-10-CM | POA: Diagnosis not present

## 2020-07-13 DIAGNOSIS — K603 Anal fistula: Secondary | ICD-10-CM | POA: Diagnosis not present

## 2020-07-13 DIAGNOSIS — I509 Heart failure, unspecified: Secondary | ICD-10-CM | POA: Diagnosis not present

## 2020-07-21 DIAGNOSIS — E782 Mixed hyperlipidemia: Secondary | ICD-10-CM | POA: Diagnosis not present

## 2020-07-21 DIAGNOSIS — I10 Essential (primary) hypertension: Secondary | ICD-10-CM | POA: Diagnosis not present

## 2020-07-21 DIAGNOSIS — I255 Ischemic cardiomyopathy: Secondary | ICD-10-CM | POA: Diagnosis not present

## 2020-07-21 DIAGNOSIS — I2510 Atherosclerotic heart disease of native coronary artery without angina pectoris: Secondary | ICD-10-CM | POA: Diagnosis not present

## 2020-07-21 DIAGNOSIS — Z7982 Long term (current) use of aspirin: Secondary | ICD-10-CM | POA: Diagnosis not present

## 2020-07-29 DIAGNOSIS — K50013 Crohn's disease of small intestine with fistula: Secondary | ICD-10-CM | POA: Diagnosis not present

## 2020-07-29 DIAGNOSIS — Z1159 Encounter for screening for other viral diseases: Secondary | ICD-10-CM | POA: Diagnosis not present

## 2020-08-01 DIAGNOSIS — H2513 Age-related nuclear cataract, bilateral: Secondary | ICD-10-CM | POA: Diagnosis not present

## 2020-08-01 DIAGNOSIS — H5203 Hypermetropia, bilateral: Secondary | ICD-10-CM | POA: Diagnosis not present

## 2020-09-06 DIAGNOSIS — K50919 Crohn's disease, unspecified, with unspecified complications: Secondary | ICD-10-CM | POA: Diagnosis not present

## 2020-09-16 DIAGNOSIS — K5090 Crohn's disease, unspecified, without complications: Secondary | ICD-10-CM | POA: Diagnosis not present

## 2020-09-21 DIAGNOSIS — K50919 Crohn's disease, unspecified, with unspecified complications: Secondary | ICD-10-CM | POA: Diagnosis not present

## 2020-10-19 DIAGNOSIS — K50919 Crohn's disease, unspecified, with unspecified complications: Secondary | ICD-10-CM | POA: Diagnosis not present

## 2020-10-24 DIAGNOSIS — K603 Anal fistula: Secondary | ICD-10-CM | POA: Diagnosis not present

## 2020-10-24 DIAGNOSIS — K5090 Crohn's disease, unspecified, without complications: Secondary | ICD-10-CM | POA: Diagnosis not present

## 2020-10-31 ENCOUNTER — Ambulatory Visit: Payer: Self-pay | Admitting: Surgery

## 2020-10-31 NOTE — H&P (View-Only) (Signed)
CC: Referred by Dr. Lilia Pro for possible anal fistula in setting of J pouch  HPI: Eric Giles is a very pleasant 59yoM with history of ulcerative colitis, CAD (s/p stents, on Brilenta), HTN, HLD who underwent a two-stage J-pouch operation with Dr. Harlon Ditty 25 years ago at [facility]. He reports that he has had reasonably good pouch function since his surgery. He does report that he has been taking suppressive ciprofloxacin for at least a decade or more control "the bacteria in his small bowel." Beginning in September 2021, he experienced some left-sided perianal discomfort and pain. He reports that he had an abscess that was drained at that time. He has had ongoing drainage since. He saw one of my partners out in [place]-Dr. Ralph Dowdy prescribed him cefuroxime and Bactrim. Since taking it, he reports significant improvement in all of his symptoms. He reports that whenever his abscess/perianal pain would flare, he experienced 20-25 bowel movements per day. Since being on the Bactrim he notes that his bowel movements have only occurred once or twice overnight in 3 or 4 times during the day. This is near his normal pouch function. Currently, denies fever/chills/nausea/vomiting. He denies any abdominal pain. He denies any history of bowel obstructions.  MRI in [place] demonstrated by report a transsphincteric anal fistula and some 'dilation' proximal to anus - unclear if this may indicate  Of note, he had ACS months ago and had a stent placed. He is currently on antiplatelet agent.  OR 05/18/20 where we did diagnostic pouchoscopy as well as EUA and placement of setons with incision and drainage of abscess. Findings intraoperatively high concerning for Crohn's disease of both his pouch and perianal area.  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 No granulomas. No dysplasia. Anal transition zone biopsy showed chronic mildly active colitis. No granulomas or  dysplasia. He is here today for follow-up. He feels overall much better than when he had an abscess. A referral was made to our GI colleagues at [facility] but he would prefer referral to [facility] as he stated his wife did not have good experience here in town. He denies any complaints today.  He has been following with gastroenterology through [facility] - actually repeated a pouchoscopy on him and biopsies. They confirmed our findings of Crohn's disease.   INTERVAL HX He has initiated on infliximab Enrique Sack) with Dr. Drema Dallas at [facility]. He is fine with their clinical satellites in [place], [place]. He reports he had 3 separate infusions. He has noticed no significant improvement in his symptoms. He is not sure if he is therapeutic from a drug level perspective. He has developed a couple other areas of drainage in the perianal skin removed from his current seton. He has upwards of 10 bowel movements a day which is at his baseline. Whenever he takes Cipro/Flagyl he notices significant improvement although this is bowel movements drop-off to 4-5/day.  PMH: Presumed UC - now s/p rPC with IPAA - Dr. Launa Flight, [facility]; CAD/MI (s/p PCI, on Brilenta); HTN; HLD  PSH: 2 stage J pouch  FHx: Denies FHx of colorectal, breast, endometrial, ovarian or cervical cancer  Social: Denies use of tobacco/EtOH/drugs. He is happily retired and lives down in [place]: A comprehensive 10 system review of systems was completed with the patient and pertinent findings as noted above.  The patient is a 75 year old male.   Allergies Mammie Lorenzo, LPN; 7/82/9562 1:30 PM) No Known Drug Allergies  [04/19/2020]: Allergies Reconciled   Medication History (  Mammie Lorenzo, LPN; 0/96/2836 6:29 PM) Folic Acid (1MG  Tablet, Oral) Active. Vitamin B6 (100MG  Tablet, Oral) Active. Clopidogrel Bisulfate (75MG  Tablet, Oral) Active. Aspirin EC Low Dose (81MG  Tablet DR, Oral) Active. Atorvastatin Calcium (80MG  Tablet, Oral)  Active. Metoprolol Tartrate (25MG  Tablet, Oral) Active. B Complex B-12 (Oral) Active. Fish Oil (Oral) Specific strength unknown - Active. Vitamin C (Oral) Specific strength unknown - Active. Vitamin D3 (Oral) Specific strength unknown - Active. Medications Reconciled    Review of Systems Harrell Gave M. Timika Muench MD; 10/24/2020 3:24 PM) General Present- Chills and Fatigue. Not Present- Appetite Loss, Fever, Night Sweats, Weight Gain and Weight Loss. Skin Present- Dryness and Non-Healing Wounds. Not Present- Change in Wart/Mole, Hives, Jaundice, New Lesions, Rash and Ulcer. HEENT Present- Seasonal Allergies and Wears glasses/contact lenses. Not Present- Earache, Hearing Loss, Hoarseness, Nose Bleed, Oral Ulcers, Ringing in the Ears, Sinus Pain, Sore Throat, Visual Disturbances and Yellow Eyes. Respiratory Not Present- Bloody sputum, Chronic Cough, Difficulty Breathing, Snoring and Wheezing. Cardiovascular Not Present- Chest Pain, Difficulty Breathing Lying Down, Leg Cramps, Palpitations, Rapid Heart Rate, Shortness of Breath and Swelling of Extremities. Male Genitourinary Not Present- Blood in Urine, Change in Urinary Stream, Frequency, Impotence, Nocturia, Painful Urination, Urgency and Urine Leakage. Musculoskeletal Present- Muscle Weakness. Not Present- Back Pain, Joint Pain, Joint Stiffness, Muscle Pain and Swelling of Extremities. Neurological Not Present- Decreased Memory, Fainting, Headaches, Numbness, Seizures, Tingling, Tremor, Trouble walking and Weakness. Hematology Present- Blood Thinners, Easy Bruising and Excessive bleeding. Not Present- Gland problems, HIV and Persistent Infections.   Physical Exam Harrell Gave M. Porcha Deblanc MD; 10/24/2020 3:25 PM) The physical exam findings are as follows: Note: Gen: NAD, comfortable; wearing mask CV: RRR GI: Abdomen is soft, NT/ND Anorectal: Draining seton in place - left lateral. 2 or 3 small apparently new appearing external openings  consistent with fistula using disease on the left perianal skin. DRE/anoscopy deferred until [place] Harrell Gave M. 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 Anal transition zone biopsy shows chronic mildly active colitis without granulomas or dysplasia. Confirmed with 2 pathologists  -He is doing well; pouchoscopy through [facility] has also shown similar findings - seeing Dr. Drema Dallas. He is now on infliximab + azathioprine. -He hasn't noticed much improvement while on infliximab. He does have at least 1 or 2 new potential fistulas in the perianal skin on the left. No active abscess at this time but there is a scant amount of tan yellow drainage. -Given these new findings, we discussed options going forward. He is interested in additional setons if we can prove they are fistulas. We discussed incision/drainage procedure of the perianal skin in the OR, possible seton placement, anorectal exam under anesthesia -Cardiac clearance in last 6 months is already in place -Will prescribe course of cipro/flagyl as this has in the past provided significant relief leading up to seton procedure

## 2020-10-31 NOTE — H&P (Signed)
CC: Referred by Dr. Lilia Pro for possible anal fistula in setting of J pouch  HPI: Mr. Freilich is a very pleasant 75yoM with history of ulcerative colitis, CAD (s/p stents, on Brilenta), HTN, HLD who underwent a two-stage J-pouch operation with Dr. Harlon Ditty 25 years ago at [facility] He reports that he has had reasonably good pouch function since his surgery. He does report that he has been taking suppressive ciprofloxacin for at least a decade or more control "the bacteria in his small bowel." Beginning in September 2021, he experienced some left-sided perianal discomfort and pain. He reports that he had an abscess that was drained at that time. He has had ongoing drainage since. He saw one of my partners out in [place]-Dr. Ralph Dowdy prescribed him cefuroxime and Bactrim. Since taking it, he reports significant improvement in all of his symptoms. He reports that whenever his abscess/perianal pain would flare, he experienced 20-25 bowel movements per day. Since being on the Bactrim he notes that his bowel movements have only occurred once or twice overnight in 3 or 4 times during the day. This is near his normal pouch function. Currently, denies fever/chills/nausea/vomiting. He denies any abdominal pain. He denies any history of bowel obstructions.  MRI in [place] demonstrated by report a transsphincteric anal fistula and some 'dilation' proximal to anus - unclear if this may indicate  Of note, he had ACS months ago and had a stent placed. He is currently on antiplatelet agent.  OR 05/18/20 where we did diagnostic pouchoscopy as well as EUA and placement of setons with incision and drainage of abscess. Findings intraoperatively high concerning for Crohn's disease of both his pouch and perianal area.  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 No granulomas. No dysplasia. Anal transition zone biopsy showed chronic mildly active colitis. No granulomas or  dysplasia. He is here today for follow-up. He feels overall much better than when he had an abscess. A referral was made to our GI colleagues at [facility] but he would prefer referral to [facility] as he stated his wife did not have good experience here in town. He denies any complaints today.  He has been following with gastroenterology through [facility] - actually repeated a pouchoscopy on him and biopsies. They confirmed our findings of Crohn's disease.   INTERVAL HX He has initiated on infliximab Enrique Sack) with Dr. Drema Dallas at [facility]. He is fine with their clinical satellites in [place], [place]. He reports he had 3 separate infusions. He has noticed no significant improvement in his symptoms. He is not sure if he is therapeutic from a drug level perspective. He has developed a couple other areas of drainage in the perianal skin removed from his current seton. He has upwards of 10 bowel movements a day which is at his baseline. Whenever he takes Cipro/Flagyl he notices significant improvement although this is bowel movements drop-off to 4-5/day.  PMH: Presumed UC - now s/p rPC with IPAA - Dr. Launa Flight, [facility]; CAD/MI (s/p PCI, on Brilenta); HTN; HLD  PSH: 2 stage J pouch  FHx: Denies FHx of colorectal, breast, endometrial, ovarian or cervical cancer  Social: Denies use of tobacco/EtOH/drugs. He is happily retired and lives down in [place]: A comprehensive 10 system review of systems was completed with the patient and pertinent findings as noted above.  The patient is a 75 year old male.   Allergies Mammie Lorenzo, LPN; 09/11/4740 5:95 PM) No Known Drug Allergies  [04/19/2020]: Allergies Reconciled   Medication History (  Mammie Lorenzo, LPN; 0/96/2836 6:29 PM) Folic Acid (1MG  Tablet, Oral) Active. Vitamin B6 (100MG  Tablet, Oral) Active. Clopidogrel Bisulfate (75MG  Tablet, Oral) Active. Aspirin EC Low Dose (81MG  Tablet DR, Oral) Active. Atorvastatin Calcium (80MG  Tablet, Oral)  Active. Metoprolol Tartrate (25MG  Tablet, Oral) Active. B Complex B-12 (Oral) Active. Fish Oil (Oral) Specific strength unknown - Active. Vitamin C (Oral) Specific strength unknown - Active. Vitamin D3 (Oral) Specific strength unknown - Active. Medications Reconciled    Review of Systems Harrell Gave M. Jacklyn Branan MD; 10/24/2020 3:24 PM) General Present- Chills and Fatigue. Not Present- Appetite Loss, Fever, Night Sweats, Weight Gain and Weight Loss. Skin Present- Dryness and Non-Healing Wounds. Not Present- Change in Wart/Mole, Hives, Jaundice, New Lesions, Rash and Ulcer. HEENT Present- Seasonal Allergies and Wears glasses/contact lenses. Not Present- Earache, Hearing Loss, Hoarseness, Nose Bleed, Oral Ulcers, Ringing in the Ears, Sinus Pain, Sore Throat, Visual Disturbances and Yellow Eyes. Respiratory Not Present- Bloody sputum, Chronic Cough, Difficulty Breathing, Snoring and Wheezing. Cardiovascular Not Present- Chest Pain, Difficulty Breathing Lying Down, Leg Cramps, Palpitations, Rapid Heart Rate, Shortness of Breath and Swelling of Extremities. Male Genitourinary Not Present- Blood in Urine, Change in Urinary Stream, Frequency, Impotence, Nocturia, Painful Urination, Urgency and Urine Leakage. Musculoskeletal Present- Muscle Weakness. Not Present- Back Pain, Joint Pain, Joint Stiffness, Muscle Pain and Swelling of Extremities. Neurological Not Present- Decreased Memory, Fainting, Headaches, Numbness, Seizures, Tingling, Tremor, Trouble walking and Weakness. Hematology Present- Blood Thinners, Easy Bruising and Excessive bleeding. Not Present- Gland problems, HIV and Persistent Infections.   Physical Exam Harrell Gave M. Jerusalem Wert MD; 10/24/2020 3:25 PM) The physical exam findings are as follows: Note: Gen: NAD, comfortable; wearing mask CV: RRR GI: Abdomen is soft, NT/ND Anorectal: Draining seton in place - left lateral. 2 or 3 small apparently new appearing external openings  consistent with fistula using disease on the left perianal skin. DRE/anoscopy deferred until [place] Harrell Gave M. Zhion Pevehouse MD; 10/24/2020 3:29 PM) CROHN'S DISEASE (K50.90) ANAL FISTULA (K60.3) Story: Mr. 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 Anal transition zone biopsy shows chronic mildly active colitis without granulomas or dysplasia. Confirmed with 2 pathologists  -He is doing well; pouchoscopy through [facility] has also shown similar findings - seeing Dr. Drema Dallas. He is now on infliximab + azathioprine. -He hasn't noticed much improvement while on infliximab. He does have at least 1 or 2 new potential fistulas in the perianal skin on the left. No active abscess at this time but there is a scant amount of tan yellow drainage. -Given these new findings, we discussed options going forward. He is interested in additional setons if we can prove they are fistulas. We discussed incision/drainage procedure of the perianal skin in the OR, possible seton placement, anorectal exam under anesthesia -Cardiac clearance in last 6 months is already in place -Will prescribe course of cipro/flagyl as this has in the past provided significant relief leading up to seton procedure

## 2020-11-01 ENCOUNTER — Encounter (HOSPITAL_BASED_OUTPATIENT_CLINIC_OR_DEPARTMENT_OTHER): Payer: Self-pay | Admitting: Surgery

## 2020-11-01 ENCOUNTER — Other Ambulatory Visit: Payer: Self-pay

## 2020-11-01 NOTE — Progress Notes (Signed)
Spoke w/ via phone for pre-op interview---pt Lab needs dos----I stat               Lab results------see below COVID test -----patient states asymptomatic no test needed Arrive at -------700 am 10-31-2020 NPO after MN NO Solid Food.  Clear liquids from MN until---600 am then npo Med rec completed Medications to take morning of surgery -----plavix per dr white instructions, metoprolol tartrate Diabetic medication -----n/a Patient instructed to bring photo id and insurance card day of surgery Patient aware to have Driver (ride ) / caregive wife debbie will stay    for 24 hours after surgery  Patient Special Instructions -----none Pre-Op special Istructions -----none Patient verbalized understanding of instructions that were given at this phone interview. Patient denies shortness of breath, chest pain, fever, cough at this phone interview.  Telephone note on chart from dr christopher white staing patient ok to stay on plavix on patint chart (pt aware), pt staying on 81 mg aspirin per dr white last dose will be 11-02-2020  [facility] cardiology dr Beatrix Fetters 07-21-2020 [facility] Echo 06-20-2020 [facility] Chest xray 12-30-2019 [facility] Cardiac cath 01-02-2020 [facility] ekg 12-30-2019 [facility]  Chart reviewed by jessica zanetto pa for 05-18-2020 surgery at [place] see 05-12-2020 progress note jessica zanetto pa note chart/epic

## 2020-11-03 ENCOUNTER — Encounter (HOSPITAL_BASED_OUTPATIENT_CLINIC_OR_DEPARTMENT_OTHER)
Admission: RE | Disposition: A | Discharge status: Home / Self Care | Payer: Self-pay | Source: Home / Self Care | Attending: Surgery

## 2020-11-03 ENCOUNTER — Ambulatory Visit (HOSPITAL_BASED_OUTPATIENT_CLINIC_OR_DEPARTMENT_OTHER): Payer: Medicare Other | Admitting: Anesthesiology

## 2020-11-03 ENCOUNTER — Other Ambulatory Visit: Payer: Self-pay

## 2020-11-03 ENCOUNTER — Encounter (HOSPITAL_BASED_OUTPATIENT_CLINIC_OR_DEPARTMENT_OTHER): Payer: Self-pay | Admitting: Surgery

## 2020-11-03 ENCOUNTER — Ambulatory Visit (HOSPITAL_BASED_OUTPATIENT_CLINIC_OR_DEPARTMENT_OTHER)
Admission: RE | Admit: 2020-11-03 | Discharge: 2020-11-03 | Disposition: A | Discharge status: Home / Self Care | Payer: Medicare Other | Attending: Surgery | Admitting: Surgery

## 2020-11-03 DIAGNOSIS — Z79899 Other long term (current) drug therapy: Secondary | ICD-10-CM | POA: Diagnosis not present

## 2020-11-03 DIAGNOSIS — K50113 Crohn's disease of large intestine with fistula: Secondary | ICD-10-CM | POA: Insufficient documentation

## 2020-11-03 DIAGNOSIS — Z955 Presence of coronary angioplasty implant and graft: Secondary | ICD-10-CM | POA: Diagnosis not present

## 2020-11-03 DIAGNOSIS — I10 Essential (primary) hypertension: Secondary | ICD-10-CM | POA: Insufficient documentation

## 2020-11-03 DIAGNOSIS — K50913 Crohn's disease, unspecified, with fistula: Secondary | ICD-10-CM | POA: Diagnosis not present

## 2020-11-03 DIAGNOSIS — Z7982 Long term (current) use of aspirin: Secondary | ICD-10-CM | POA: Insufficient documentation

## 2020-11-03 DIAGNOSIS — K5010 Crohn's disease of large intestine without complications: Secondary | ICD-10-CM | POA: Diagnosis present

## 2020-11-03 DIAGNOSIS — I252 Old myocardial infarction: Secondary | ICD-10-CM | POA: Diagnosis not present

## 2020-11-03 DIAGNOSIS — I2510 Atherosclerotic heart disease of native coronary artery without angina pectoris: Secondary | ICD-10-CM | POA: Diagnosis not present

## 2020-11-03 DIAGNOSIS — Z980 Intestinal bypass and anastomosis status: Secondary | ICD-10-CM | POA: Insufficient documentation

## 2020-11-03 DIAGNOSIS — Z87891 Personal history of nicotine dependence: Secondary | ICD-10-CM | POA: Insufficient documentation

## 2020-11-03 DIAGNOSIS — Z7902 Long term (current) use of antithrombotics/antiplatelets: Secondary | ICD-10-CM | POA: Insufficient documentation

## 2020-11-03 DIAGNOSIS — Z7901 Long term (current) use of anticoagulants: Secondary | ICD-10-CM | POA: Diagnosis not present

## 2020-11-03 DIAGNOSIS — K611 Rectal abscess: Secondary | ICD-10-CM | POA: Diagnosis not present

## 2020-11-03 DIAGNOSIS — E785 Hyperlipidemia, unspecified: Secondary | ICD-10-CM | POA: Insufficient documentation

## 2020-11-03 HISTORY — DX: Low back pain, unspecified: M54.50

## 2020-11-03 HISTORY — PX: PLACEMENT OF SETON: SHX6029

## 2020-11-03 HISTORY — DX: Presence of spectacles and contact lenses: Z97.3

## 2020-11-03 HISTORY — PX: RECTAL EXAM UNDER ANESTHESIA: SHX6399

## 2020-11-03 HISTORY — PX: INCISION AND DRAINAGE ABSCESS: SHX5864

## 2020-11-03 HISTORY — DX: Presence of dental prosthetic device (complete) (partial): Z97.2

## 2020-11-03 LAB — POCT I-STAT, CHEM 8
BUN: 15 mg/dL (ref 8–23)
Calcium, Ion: 1.32 mmol/L (ref 1.15–1.40)
Chloride: 105 mmol/L (ref 98–111)
Creatinine, Ser: 1.9 mg/dL — ABNORMAL HIGH (ref 0.61–1.24)
Glucose, Bld: 111 mg/dL — ABNORMAL HIGH (ref 70–99)
HCT: 38 % — ABNORMAL LOW (ref 39.0–52.0)
Hemoglobin: 12.9 g/dL — ABNORMAL LOW (ref 13.0–17.0)
Potassium: 3.9 mmol/L (ref 3.5–5.1)
Sodium: 138 mmol/L (ref 135–145)
TCO2: 22 mmol/L (ref 22–32)

## 2020-11-03 SURGERY — INCISION AND DRAINAGE, ABSCESS
Anesthesia: General | Site: Rectum

## 2020-11-03 MED ORDER — LIDOCAINE 2% (20 MG/ML) 5 ML SYRINGE
INTRAMUSCULAR | Status: AC
  Filled 2020-11-03: qty 5

## 2020-11-03 MED ORDER — EPHEDRINE SULFATE 50 MG/ML IJ SOLN
INTRAMUSCULAR | Status: DC | PRN
  Administered 2020-11-03: 5 mg via INTRAVENOUS
  Administered 2020-11-03: 7 mg via INTRAVENOUS

## 2020-11-03 MED ORDER — CHLORHEXIDINE GLUCONATE CLOTH 2 % EX PADS: 6.0000 | MEDICATED_PAD | Freq: Once | CUTANEOUS | Status: DC

## 2020-11-03 MED ORDER — 0.9 % SODIUM CHLORIDE (POUR BTL) OPTIME
TOPICAL | Status: DC | PRN
  Administered 2020-11-03: 500 mL

## 2020-11-03 MED ORDER — ACETAMINOPHEN 500 MG PO TABS
ORAL_TABLET | ORAL | Status: AC
  Filled 2020-11-03: qty 2

## 2020-11-03 MED ORDER — ONDANSETRON HCL 4 MG/2ML IJ SOLN
INTRAMUSCULAR | Status: DC | PRN
  Administered 2020-11-03: 4 mg via INTRAVENOUS

## 2020-11-03 MED ORDER — DIBUCAINE (PERIANAL) 1 % EX OINT
TOPICAL_OINTMENT | CUTANEOUS | Status: DC | PRN
  Administered 2020-11-03: 1 via RECTAL

## 2020-11-03 MED ORDER — PHENYLEPHRINE 40 MCG/ML (10ML) SYRINGE FOR IV PUSH (FOR BLOOD PRESSURE SUPPORT)
PREFILLED_SYRINGE | INTRAVENOUS | Status: AC
  Filled 2020-11-03: qty 10

## 2020-11-03 MED ORDER — MIDAZOLAM HCL 2 MG/2ML IJ SOLN
INTRAMUSCULAR | Status: AC
  Filled 2020-11-03: qty 2

## 2020-11-03 MED ORDER — BUPIVACAINE LIPOSOME 1.3 % IJ SUSP
INTRAMUSCULAR | Status: DC | PRN
  Administered 2020-11-03: 12 mL

## 2020-11-03 MED ORDER — BUPIVACAINE LIPOSOME 1.3 % IJ SUSP: 20.0000 mL | Freq: Once | INTRAMUSCULAR | Status: DC

## 2020-11-03 MED ORDER — LACTATED RINGERS IV SOLN: INTRAVENOUS | Status: DC

## 2020-11-03 MED ORDER — BUPIVACAINE-EPINEPHRINE 0.25% -1:200000 IJ SOLN
INTRAMUSCULAR | Status: DC | PRN
  Administered 2020-11-03: 18 mL

## 2020-11-03 MED ORDER — PHENYLEPHRINE HCL (PRESSORS) 10 MG/ML IV SOLN
INTRAVENOUS | Status: DC | PRN
  Administered 2020-11-03 (×2): 40 ug via INTRAVENOUS

## 2020-11-03 MED ORDER — ONDANSETRON HCL 4 MG/2ML IJ SOLN
INTRAMUSCULAR | Status: AC
  Filled 2020-11-03: qty 2

## 2020-11-03 MED ORDER — METRONIDAZOLE 500 MG/100ML IV SOLN
500.0000 mg | INTRAVENOUS | Status: AC
  Administered 2020-11-03: 500 mg via INTRAVENOUS

## 2020-11-03 MED ORDER — DEXAMETHASONE SODIUM PHOSPHATE 10 MG/ML IJ SOLN
INTRAMUSCULAR | Status: AC
  Filled 2020-11-03: qty 1

## 2020-11-03 MED ORDER — EPHEDRINE 5 MG/ML INJ
INTRAVENOUS | Status: AC
  Filled 2020-11-03: qty 10

## 2020-11-03 MED ORDER — LIDOCAINE HCL 1 % IJ SOLN
INTRAMUSCULAR | Status: DC | PRN
  Administered 2020-11-03: 60 mg via INTRADERMAL

## 2020-11-03 MED ORDER — ACETAMINOPHEN 500 MG PO TABS
1000.0000 mg | ORAL_TABLET | ORAL | Status: AC
  Administered 2020-11-03: 1000 mg via ORAL

## 2020-11-03 MED ORDER — DEXAMETHASONE SODIUM PHOSPHATE 4 MG/ML IJ SOLN
INTRAMUSCULAR | Status: DC | PRN
  Administered 2020-11-03: 4 mg via INTRAVENOUS

## 2020-11-03 MED ORDER — CIPROFLOXACIN IN D5W 400 MG/200ML IV SOLN
400.0000 mg | INTRAVENOUS | Status: AC
  Administered 2020-11-03: 400 mg via INTRAVENOUS

## 2020-11-03 MED ORDER — PROPOFOL 10 MG/ML IV BOLUS
INTRAVENOUS | Status: AC
  Filled 2020-11-03: qty 20

## 2020-11-03 MED ORDER — METRONIDAZOLE 500 MG/100ML IV SOLN
INTRAVENOUS | Status: AC
  Filled 2020-11-03: qty 100

## 2020-11-03 MED ORDER — FENTANYL CITRATE (PF) 100 MCG/2ML IJ SOLN
INTRAMUSCULAR | Status: DC | PRN
  Administered 2020-11-03: 50 ug via INTRAVENOUS

## 2020-11-03 MED ORDER — FENTANYL CITRATE (PF) 100 MCG/2ML IJ SOLN
INTRAMUSCULAR | Status: AC
  Filled 2020-11-03: qty 2

## 2020-11-03 MED ORDER — FENTANYL CITRATE (PF) 100 MCG/2ML IJ SOLN: 25.0000 ug | INTRAMUSCULAR | Status: DC | PRN

## 2020-11-03 MED ORDER — CIPROFLOXACIN IN D5W 400 MG/200ML IV SOLN
INTRAVENOUS | Status: AC
  Filled 2020-11-03: qty 200

## 2020-11-03 MED ORDER — TRAMADOL HCL 50 MG PO TABS: 50.0000 mg | ORAL_TABLET | Freq: Four times a day (QID) | ORAL | 0 refills | Status: AC | PRN

## 2020-11-03 MED ORDER — PROPOFOL 10 MG/ML IV BOLUS
INTRAVENOUS | Status: DC | PRN
  Administered 2020-11-03: 20 mg via INTRAVENOUS
  Administered 2020-11-03: 10 mg via INTRAVENOUS

## 2020-11-03 SURGICAL SUPPLY — 63 items
APL SKNCLS STERI-STRIP NONHPOA (GAUZE/BANDAGES/DRESSINGS) ×4
BENZOIN TINCTURE PRP APPL 2/3 (GAUZE/BANDAGES/DRESSINGS) ×6 IMPLANT
BLADE EXTENDED COATED 6.5IN (ELECTRODE) IMPLANT
BLADE HEX COATED 2.75 (ELECTRODE) IMPLANT
BLADE SURG 10 STRL SS (BLADE) IMPLANT
BLADE SURG 15 STRL LF DISP TIS (BLADE) ×2 IMPLANT
BLADE SURG 15 STRL SS (BLADE) ×3
BRIEF STRETCH FOR OB PAD LRG (UNDERPADS AND DIAPERS) ×3 IMPLANT
COVER BACK TABLE 60X90IN (DRAPES) ×3 IMPLANT
COVER MAYO STAND STRL (DRAPES) ×3 IMPLANT
COVER WAND RF STERILE (DRAPES) ×3 IMPLANT
DECANTER SPIKE VIAL GLASS SM (MISCELLANEOUS) ×3 IMPLANT
DRAPE HYSTEROSCOPY (MISCELLANEOUS) IMPLANT
DRAPE LAPAROTOMY 100X72 PEDS (DRAPES) ×3 IMPLANT
DRAPE SHEET LG 3/4 BI-LAMINATE (DRAPES) IMPLANT
DRAPE UTILITY XL STRL (DRAPES) ×3 IMPLANT
DRSG PAD ABDOMINAL 8X10 ST (GAUZE/BANDAGES/DRESSINGS) ×3 IMPLANT
GAUZE SPONGE 4X4 12PLY STRL (GAUZE/BANDAGES/DRESSINGS) ×3 IMPLANT
GAUZE SPONGE 4X4 3PLY NS LF (GAUZE/BANDAGES/DRESSINGS) ×3 IMPLANT
GLOVE SURG ENC MOIS LTX SZ7.5 (GLOVE) ×3 IMPLANT
GLOVE SURG UNDER LTX SZ8 (GLOVE) ×3 IMPLANT
GOWN STRL REUS W/TWL LRG LVL3 (GOWN DISPOSABLE) ×3 IMPLANT
HYDROGEN PEROXIDE 16OZ (MISCELLANEOUS) IMPLANT
IV CATH 14GX2 1/4 (CATHETERS) IMPLANT
IV CATH 18G SAFETY (IV SOLUTION) IMPLANT
KIT SIGMOIDOSCOPE (SET/KITS/TRAYS/PACK) IMPLANT
KIT TURNOVER CYSTO (KITS) ×3 IMPLANT
LEGGING LITHOTOMY PAIR STRL (DRAPES) IMPLANT
LOOP VESSEL MAXI BLUE (MISCELLANEOUS) ×6 IMPLANT
NDL SAFETY ECLIPSE 18X1.5 (NEEDLE) IMPLANT
NEEDLE HYPO 18GX1.5 SHARP (NEEDLE)
NEEDLE HYPO 22GX1.5 SAFETY (NEEDLE) ×3 IMPLANT
NEEDLE HYPO 25X1 1.5 SAFETY (NEEDLE) IMPLANT
NS IRRIG 500ML POUR BTL (IV SOLUTION) ×3 IMPLANT
PACK BASIN DAY SURGERY FS (CUSTOM PROCEDURE TRAY) ×3 IMPLANT
PENCIL SMOKE EVACUATOR (MISCELLANEOUS) ×3 IMPLANT
SPONGE HEMORRHOID 8X3CM (HEMOSTASIS) IMPLANT
SPONGE SURGIFOAM ABS GEL 12-7 (HEMOSTASIS) IMPLANT
SUCTION FRAZIER HANDLE 10FR (MISCELLANEOUS)
SUCTION TUBE FRAZIER 10FR DISP (MISCELLANEOUS) IMPLANT
SUT CHROMIC 2 0 SH (SUTURE) IMPLANT
SUT CHROMIC 3 0 SH 27 (SUTURE) IMPLANT
SUT MNCRL AB 4-0 PS2 18 (SUTURE) IMPLANT
SUT SILK 0 PSL NDL (SUTURE) IMPLANT
SUT SILK 0 TIES 10X30 (SUTURE) ×3 IMPLANT
SUT SILK 2 0 (SUTURE)
SUT SILK 2-0 18XBRD TIE 12 (SUTURE) IMPLANT
SUT VIC AB 2-0 SH 27 (SUTURE)
SUT VIC AB 2-0 SH 27XBRD (SUTURE) IMPLANT
SUT VIC AB 3-0 SH 18 (SUTURE) IMPLANT
SUT VIC AB 3-0 SH 27 (SUTURE)
SUT VIC AB 3-0 SH 27X BRD (SUTURE) IMPLANT
SUT VIC AB 3-0 SH 27XBRD (SUTURE) IMPLANT
SUT VIC AB 4-0 P-3 18XBRD (SUTURE) IMPLANT
SUT VIC AB 4-0 P3 18 (SUTURE)
SYR 20ML LL LF (SYRINGE) IMPLANT
SYR BULB IRRIG 60ML STRL (SYRINGE) ×3 IMPLANT
SYR CONTROL 10ML LL (SYRINGE) ×3 IMPLANT
SYR TB 1ML LL NO SAFETY (SYRINGE) IMPLANT
TOWEL OR 17X26 10 PK STRL BLUE (TOWEL DISPOSABLE) ×3 IMPLANT
TRAY DSU PREP LF (CUSTOM PROCEDURE TRAY) ×3 IMPLANT
TUBE CONNECTING 12X1/4 (SUCTIONS) ×3 IMPLANT
YANKAUER SUCT BULB TIP NO VENT (SUCTIONS) ×3 IMPLANT

## 2020-11-03 NOTE — Discharge Instructions (Addendum)
ANORECTAL SURGERY: POST OP INSTRUCTIONS  1. DIET: Follow a light bland diet the first 24 hours after arrival home, such as soup, liquids, crackers, etc.  Be sure to include lots of fluids daily.  Avoid fast food or heavy meals as your are more likely to get nauseated.  Eat a low fat diet the next few days after surgery.   2. Some bleeding with bowel movements is expected for the first couple of days but this should stop in between bowel movements  3. Take your usually prescribed home medications unless otherwise directed.  4. PAIN CONTROL: a. It is helpful to take an over-the-counter pain medication regularly for the first few days/weeks.  Choose from the following that works best for you: i. Ibuprofen (Advil, etc) Three 200mg  tabs every 6 hours as needed. ii. Acetaminophen (Tylenol, etc) 500-650mg  every 6 hours as needed after 2pm today. iii. NOTE: You may take both of these medications together - most patients find it most helpful when alternating between the two (i.e. Ibuprofen at 6am, tylenol at 9am, ibuprofen at 12pm ...) b. A  prescription for pain medication may have been prescribed for you at discharge.  Take your pain medication as prescribed.  i. If you are having problems/concerns with the prescription medicine, please call us for further advice.  5. Avoid getting constipated.  Between the surgery and the pain medications, it is common to experience some constipation.  Increasing fluid intake (64oz of water per day) and taking a fiber supplement (such as Metamucil, Citrucel, FiberCon) 1-2 times a day regularly will usually help prevent this problem from occurring.  Take Miralax (over the counter) 1-2x/day while taking a narcotic pain medication. If no bowel movement after 48hours, you may additionally take a laxative like a bottle of Milk of Magnesia which can be purchased over the counter. Avoid enemas if possible as these are often painful.   6. Watch out for diarrhea.  If you have many  loose bowel movements, simplify your diet to bland foods.  Stop any stool softeners and decrease your fiber supplement. If this worsens or does not improve, please call us.  7. Wash / shower every day.  If you were discharged with a dressing, you may remove this the day after your surgery. You may shower normally, getting soap/water on your wound, particularly after bowel movements.  8. Soaking in a warm bath filled a couple inches ("Sitz bath") is a great way to clean the area after a bowel movement and many patients find it is a way to soothe the area.  9. ACTIVITIES as tolerated:   a. You may resume regular (light) daily activities beginning the next day--such as daily self-care, walking, climbing stairs--gradually increasing activities as tolerated.  If you can walk 30 minutes without difficulty, it is safe to try more intense activity such as jogging, treadmill, bicycling, low-impact aerobics, etc. b. Refrain from any heavy lifting or straining for the first 2 weeks after your procedure, particularly if your surgery was for hemorrhoids. c. Avoid activities that make your pain worse d. You may drive when you are no longer taking prescription pain medication, you can comfortably wear a seatbelt, and you can safely maneuver your car and apply brakes.  10. FOLLOW UP in our office a. Please call [facility] at [phone] to set up an appointment to see your surgeon in the office for a follow-up appointment approximately 2 weeks after your surgery. b. Make sure that you call for this appointment the  day you arrive home to insure a convenient appointment time.  9. If you have disability or family leave forms that need to be completed, you may have them completed by your primary care physician's office; for return to work instructions, please ask our office staff and they will be happy to assist you in obtaining this documentation   When to call us [phone]: 1. Poor pain control 2. Reactions /  problems with new medications (rash/itching, etc)  3. Fever over 101.5 F (38.5 C) 4. Inability to urinate 5. Nausea/vomiting 6. Worsening swelling or bruising 7. Continued bleeding from incision. 8. Increased pain, redness, or drainage from the incision  The clinic staff is available to answer your questions during regular business hours (8:30am-5pm).  Please don't hesitate to call and ask to speak to one of our nurses for clinical concerns.   A surgeon from [facility] is always on call at the hospitals   If you have a medical emergency, go to the nearest emergency room or call 911.   [facility], [place], [place], [place], [place]  [address] ? MAIN: [phone] FAX [phone] [email or web address]   Post Anesthesia Home Care Instructions  Activity: Get plenty of rest for the remainder of the day. A responsible individual must stay with you for 24 hours following the procedure.  For the next 24 hours, DO NOT: -Drive a car -Paediatric nurse -Drink alcoholic beverages -Take any medication unless instructed by your physician -Make any legal decisions or sign important papers.  Meals: Start with liquid foods such as gelatin or soup. Progress to regular foods as tolerated. Avoid greasy, spicy, heavy foods. If nausea and/or vomiting occur, drink only clear liquids until the nausea and/or vomiting subsides. Call your physician if vomiting continues.  Special Instructions/Symptoms: Your throat may feel dry or sore from the anesthesia or the breathing tube placed in your throat during surgery. If this causes discomfort, gargle with warm salt water. The discomfort should disappear within 24 hours.    Information for Discharge Teaching: EXPAREL (bupivacaine liposome injectable suspension)   Your surgeon or anesthesiologist gave you EXPAREL(bupivacaine) to help control your pain after surgery.   EXPAREL is a local anesthetic  that provides pain relief by numbing the tissue around the surgical site.  EXPAREL is designed to release pain medication over time and can control pain for up to 72 hours.  Depending on how you respond to EXPAREL, you may require less pain medication during your recovery.  Possible side effects:  Temporary loss of sensation or ability to move in the area where bupivacaine was injected.  Nausea, vomiting, constipation  Rarely, numbness and tingling in your mouth or lips, lightheadedness, or anxiety may occur.  Call your doctor right away if you think you may be experiencing any of these sensations, or if you have other questions regarding possible side effects.  Follow all other discharge instructions given to you by your surgeon or nurse. Eat a healthy diet and drink plenty of water or other fluids.  If you return to the hospital for any reason within 96 hours following the administration of EXPAREL, it is important for health care providers to know that you have received this anesthetic. A teal colored band has been placed on your arm with the date, time and amount of EXPAREL you have received in order to alert and inform your health care providers. Please leave this armband in place for the full 96 hours following  administration, and then you may remove the band.

## 2020-11-03 NOTE — Anesthesia Postprocedure Evaluation (Signed)
Anesthesia Post Note  Patient: Eric Giles  Procedure(s) Performed: INCISION AND DRAINAGE PERIANAL ABSCESS x2 (N/A Buttocks) PLACEMENT OF SETON x3 (N/A Rectum) ANORECTAL EXAM UNDER ANESTHESIA (N/A Rectum)     Patient location during evaluation: PACU Anesthesia Type: General Level of consciousness: awake Pain management: pain level controlled Vital Signs Assessment: post-procedure vital signs reviewed and stable Respiratory status: spontaneous breathing Cardiovascular status: stable Postop Assessment: no apparent nausea or vomiting Anesthetic complications: no   No complications documented.  Last Vitals:  Vitals:   11/03/20 1115 11/03/20 1207  BP: (!) 161/75 (!) 144/68  Pulse: (!) 50 (!) 47  Resp: 15 14  Temp:  (!) 36.3 C  SpO2: 100% 100%    Last Pain:  Vitals:   11/03/20 1207  TempSrc:   PainSc: 0-No pain                 Philicia Heyne

## 2020-11-03 NOTE — Anesthesia Procedure Notes (Signed)
Procedure Name: LMA Insertion Date/Time: 11/03/2020 10:15 AM Performed by: Garrel Ridgel, CRNA Pre-anesthesia Checklist: Patient identified, Emergency Drugs available, Suction available, Patient being monitored and Timeout performed Patient Re-evaluated:Patient Re-evaluated prior to induction Oxygen Delivery Method: Circle system utilized Preoxygenation: Pre-oxygenation with 100% oxygen Induction Type: IV induction Ventilation: Mask ventilation without difficulty LMA Size: 5.0 Number of attempts: 2 Placement Confirmation: positive ETCO2 Tube secured with: Tape Dental Injury: Teeth and Oropharynx as per pre-operative assessment

## 2020-11-03 NOTE — Anesthesia Postprocedure Evaluation (Signed)
Anesthesia Post Note  Patient: Eric Giles  Procedure(s) Performed: INCISION AND DRAINAGE PERIANAL ABSCESS x2 (N/A Buttocks) PLACEMENT OF SETON x3 (N/A Rectum) ANORECTAL EXAM UNDER ANESTHESIA (N/A Rectum)     Patient location during evaluation: PACU Anesthesia Type: General Level of consciousness: awake Pain management: pain level controlled Vital Signs Assessment: post-procedure vital signs reviewed and stable Respiratory status: spontaneous breathing Cardiovascular status: stable Postop Assessment: no apparent nausea or vomiting Anesthetic complications: no   No complications documented.  Last Vitals:  Vitals:   11/03/20 0709 11/03/20 1100  BP: 139/75 131/68  Pulse: (!) 50 67  Resp: 16 15  Temp: 36.5 C (!) 36.3 C  SpO2: 100% 100%    Last Pain:  Vitals:   11/03/20 1100  TempSrc:   PainSc: 0-No pain                 Carmon Brigandi

## 2020-11-03 NOTE — Op Note (Signed)
11/03/2020  10:54 AM  PATIENT:  Eric Giles  75 y.o. male  Patient Care Team: Patient, No Pcp Per (Inactive) as PCP - General (General Practice)  PRE-OPERATIVE DIAGNOSIS:   1. Perianal Crohn's disease with fistulas to J pouch 2. History of restorative proctocolectomy with ileal pouch anal anastomosis  POST-OPERATIVE DIAGNOSIS:   Same + perirectal abscess x2 and 2 new fistulas  PROCEDURE:   1. Incision and drainage of perirectal abscess x 2 2. Placement of draining seton x 2 3. Anorectal exam under anesthesia  SURGEON:  Surgeon(s): Ileana Roup, MD   ANESTHESIA:   local and general  SPECIMEN:  None  DISPOSITION OF SPECIMEN:  N/A  COUNTS:  Sponge, needle, and instrument counts were reported correct x2 at conclusion.  EBL: 5 mL  PLAN OF CARE: Discharge to home after PACU  PATIENT DISPOSITION:  PACU - hemodynamically stable.  OR FINDINGS: 2 new perirectal abscesses on the left side - these all share an external opening with his existing transsphincteric anal fistula.  His existing seton was exchanged for a new fresh seton.  2 additional blue vessel loop setons were placed.  DESCRIPTION: The patient was identified in the preoperative holding area and taken to the OR where he was placed on the operating room table. SCDs were placed.  General anesthesia was induced without difficulty using LMA. The patient was then positioned in high lithotomy with Allen stirrups. Pressure points were then evaluated and padded.  He was then prepped and draped in usual sterile fashion.  A surgical timeout was performed indicating the correct patient, procedure, and positioning.  A perianal block was performed using a dilute mixture of 0.25% Marcaine with epinephrine and Exparel.   After ascertaining that an appropriate level of anesthesia had been achieved, a well lubricated digital rectal exam was performed. This demonstrated a soft ileal pouch anal anastomosis which is widely patent  and no evident stenosis.  A Hill-Ferguson anoscope was into the anal canal and circumferential inspection demonstrated relatively healthy appearing anoderm with his existing fistula readily identified with his indwelling seton.  Externally, there is evidence of 2 additional abscesses with 2 small fluctuant areas on the left perianal skin within the ischio anal fossa.  These were separately incised and drained.  Approximately 2 to 3 cc of purulent fluid was evacuated from both.  These were then carefully probed.  They were found to communicate with his existing fistula and all share a common external opening.  2 additional blue vessel loop setons were placed and secured to themselves using 2-0 silk suture.  His existing seton was exchanged for a fresh blue vessel loop seton also secured to itself using 2-0 silk suture.  The abscess incision and drainage locations were then irrigated.  Hemostasis was achieved with electrocautery.  Additional local anesthetic was infiltrated.  Circumferential palpation of the perianal tissues and ischial anal fossas demonstrates no other evident abscesses or undrained collections.  The right perianal skin is relatively spared from this process.  Topical Dibucaine ointment was applied.  Sponge, needle, and instrument counts were reported correct x2.  A dressing consisting of 4 x 4's, ABD and mesh underwear was placed.  He had been taken out of lithotomy position, was awakened from anesthesia and transferred to a stretcher for transport to PACU in satisfactory condition.  DISPOSITION: PACU in satisfactory condition.

## 2020-11-03 NOTE — Interval H&P Note (Signed)
History and Physical Interval Note:  11/03/2020 7:36 AM  Eric Giles  has presented today for surgery, with the diagnosis of Perianal Crohn's disease.  The various methods of treatment have been discussed with the patient. After consideration of procedure, material risks, benefits and other options for treatment, the patient has consented to  Procedure(s): INCISION AND DRAINAGE PERIANAL ABSCESS (N/A) PLACEMENT OF SETON (N/A) ANORECTAL EXAM UNDER ANESTHESIA (N/A) as a surgical intervention.  The patient's history has been reviewed, patient examined, no change in status, stable for surgery.  I have reviewed the patient's chart and labs.  Questions were answered to the patient's satisfaction.     [place]

## 2020-11-03 NOTE — Transfer of Care (Signed)
Immediate Anesthesia Transfer of Care Note  Patient: Eric Giles  Procedure(s) Performed: INCISION AND DRAINAGE PERIANAL ABSCESS x2 (N/A Buttocks) PLACEMENT OF SETON x3 (N/A Rectum) ANORECTAL EXAM UNDER ANESTHESIA (N/A Rectum)  Patient Location: PACU  Anesthesia Type:General  Level of Consciousness: awake, alert , oriented and patient cooperative  Airway & Oxygen Therapy: Patient Spontanous Breathing and Patient connected to face mask oxygen  Post-op Assessment: Report given to RN and Post -op Vital signs reviewed and stable  Post vital signs: Reviewed and stable  Last Vitals:  Vitals Value Taken Time  BP 161/75 11/03/20 1115  Temp 36.3 C 11/03/20 1100  Pulse 48 11/03/20 1120  Resp 14 11/03/20 1120  SpO2 100 % 11/03/20 1120  Vitals shown include unvalidated device data.  Last Pain:  Vitals:   11/03/20 1100  TempSrc:   PainSc: 0-No pain         Complications: No complications documented.

## 2020-11-03 NOTE — Anesthesia Preprocedure Evaluation (Addendum)
Anesthesia Evaluation  Patient identified by MRN, date of birth, ID band Patient awake    Reviewed: Allergy & Precautions, NPO status , Patient's Chart, lab work & pertinent test results  Airway Mallampati: II  TM Distance: >3 FB     Dental   Pulmonary neg pulmonary ROS, former smoker,    breath sounds clear to auscultation       Cardiovascular hypertension, + CAD and + Past MI   Rhythm:Regular Rate:Normal  Patient denies CP or SOB. Dr. Nyoka Cowden   Neuro/Psych    GI/Hepatic Neg liver ROS, PUD,   Endo/Other  negative endocrine ROS  Renal/GU negative Renal ROS     Musculoskeletal   Abdominal   Peds  Hematology negative hematology ROS (+)   Anesthesia Other Findings   Reproductive/Obstetrics                             Anesthesia Physical Anesthesia Plan  ASA: III  Anesthesia Plan: General   Post-op Pain Management:    Induction: Intravenous  PONV Risk Score and Plan: 3 and Ondansetron, Dexamethasone and Midazolam  Airway Management Planned: Oral ETT  Additional Equipment:   Intra-op Plan:   Post-operative Plan: Extubation in OR  Informed Consent: I have reviewed the patients History and Physical, chart, labs and discussed the procedure including the risks, benefits and alternatives for the proposed anesthesia with the patient or authorized representative who has indicated his/her understanding and acceptance.       Plan Discussed with: CRNA and Anesthesiologist  Anesthesia Plan Comments:         Anesthesia Quick Evaluation

## 2020-11-04 ENCOUNTER — Encounter (HOSPITAL_BASED_OUTPATIENT_CLINIC_OR_DEPARTMENT_OTHER): Payer: Self-pay | Admitting: Surgery

## 2020-12-14 DIAGNOSIS — K50919 Crohn's disease, unspecified, with unspecified complications: Secondary | ICD-10-CM | POA: Diagnosis not present

## 2021-01-17 DIAGNOSIS — I10 Essential (primary) hypertension: Secondary | ICD-10-CM | POA: Diagnosis not present

## 2021-01-17 DIAGNOSIS — I2510 Atherosclerotic heart disease of native coronary artery without angina pectoris: Secondary | ICD-10-CM | POA: Diagnosis not present

## 2021-01-17 DIAGNOSIS — I255 Ischemic cardiomyopathy: Secondary | ICD-10-CM | POA: Diagnosis not present

## 2021-01-17 DIAGNOSIS — E782 Mixed hyperlipidemia: Secondary | ICD-10-CM | POA: Diagnosis not present

## 2021-01-17 DIAGNOSIS — Z7982 Long term (current) use of aspirin: Secondary | ICD-10-CM | POA: Diagnosis not present

## 2021-01-24 ENCOUNTER — Telehealth: Payer: Self-pay | Admitting: Gastroenterology

## 2021-01-24 NOTE — Telephone Encounter (Signed)
Hey Dr. Ardis Hughs,   We received a referral in our office for crohn's disease and abnormal LFTs. Patient prefer to see you as his GI provider. I have records. Could you please review and advise on scheduling?  Thank you.

## 2021-01-24 NOTE — Telephone Encounter (Signed)
He has complicated Crohn's disease and is currently under the care of a tertiary IBD center at [facility].  I am not willing to take over his care, he should stay with them as they are certainly more experienced in matters such as his.

## 2021-01-24 NOTE — Telephone Encounter (Signed)
Spoke with patient about Dr. Ardis Hughs recommendation. Patient expressed understanding.

## 2021-02-01 DIAGNOSIS — K91850 Pouchitis: Secondary | ICD-10-CM | POA: Diagnosis not present

## 2021-02-01 DIAGNOSIS — K50113 Crohn's disease of large intestine with fistula: Secondary | ICD-10-CM | POA: Diagnosis not present

## 2021-02-06 DIAGNOSIS — K5190 Ulcerative colitis, unspecified, without complications: Secondary | ICD-10-CM | POA: Diagnosis not present

## 2021-02-06 DIAGNOSIS — Z0000 Encounter for general adult medical examination without abnormal findings: Secondary | ICD-10-CM | POA: Diagnosis not present

## 2021-02-06 DIAGNOSIS — M47819 Spondylosis without myelopathy or radiculopathy, site unspecified: Secondary | ICD-10-CM | POA: Diagnosis not present

## 2021-02-06 DIAGNOSIS — K91850 Pouchitis: Secondary | ICD-10-CM | POA: Diagnosis not present

## 2021-02-06 DIAGNOSIS — N1832 Chronic kidney disease, stage 3b: Secondary | ICD-10-CM | POA: Diagnosis not present

## 2021-02-09 DIAGNOSIS — K5010 Crohn's disease of large intestine without complications: Secondary | ICD-10-CM | POA: Diagnosis not present

## 2021-02-23 DIAGNOSIS — K50919 Crohn's disease, unspecified, with unspecified complications: Secondary | ICD-10-CM | POA: Diagnosis not present

## 2021-04-20 DIAGNOSIS — K50919 Crohn's disease, unspecified, with unspecified complications: Secondary | ICD-10-CM | POA: Diagnosis not present

## 2021-06-15 DIAGNOSIS — K50919 Crohn's disease, unspecified, with unspecified complications: Secondary | ICD-10-CM | POA: Diagnosis not present

## 2021-07-04 DIAGNOSIS — H2513 Age-related nuclear cataract, bilateral: Secondary | ICD-10-CM | POA: Diagnosis not present

## 2021-07-04 DIAGNOSIS — H5203 Hypermetropia, bilateral: Secondary | ICD-10-CM | POA: Diagnosis not present

## 2021-07-20 DIAGNOSIS — I2510 Atherosclerotic heart disease of native coronary artery without angina pectoris: Secondary | ICD-10-CM | POA: Diagnosis not present

## 2021-07-20 DIAGNOSIS — I255 Ischemic cardiomyopathy: Secondary | ICD-10-CM | POA: Diagnosis not present

## 2021-07-20 DIAGNOSIS — E782 Mixed hyperlipidemia: Secondary | ICD-10-CM | POA: Diagnosis not present

## 2021-07-20 DIAGNOSIS — I10 Essential (primary) hypertension: Secondary | ICD-10-CM | POA: Diagnosis not present

## 2021-07-20 DIAGNOSIS — Z7982 Long term (current) use of aspirin: Secondary | ICD-10-CM | POA: Diagnosis not present

## 2021-08-10 DIAGNOSIS — K50919 Crohn's disease, unspecified, with unspecified complications: Secondary | ICD-10-CM | POA: Diagnosis not present

## 2021-08-25 DIAGNOSIS — K5190 Ulcerative colitis, unspecified, without complications: Secondary | ICD-10-CM | POA: Diagnosis not present

## 2021-08-25 DIAGNOSIS — B360 Pityriasis versicolor: Secondary | ICD-10-CM | POA: Diagnosis not present

## 2021-08-25 DIAGNOSIS — N1832 Chronic kidney disease, stage 3b: Secondary | ICD-10-CM | POA: Diagnosis not present

## 2021-08-25 DIAGNOSIS — K50913 Crohn's disease, unspecified, with fistula: Secondary | ICD-10-CM | POA: Diagnosis not present

## 2021-08-25 DIAGNOSIS — L282 Other prurigo: Secondary | ICD-10-CM | POA: Diagnosis not present

## 2021-08-25 DIAGNOSIS — Z6822 Body mass index (BMI) 22.0-22.9, adult: Secondary | ICD-10-CM | POA: Diagnosis not present

## 2021-08-30 DIAGNOSIS — Z6822 Body mass index (BMI) 22.0-22.9, adult: Secondary | ICD-10-CM | POA: Diagnosis not present

## 2021-08-30 DIAGNOSIS — B356 Tinea cruris: Secondary | ICD-10-CM | POA: Diagnosis not present

## 2021-09-27 DIAGNOSIS — L299 Pruritus, unspecified: Secondary | ICD-10-CM | POA: Diagnosis not present

## 2021-09-27 DIAGNOSIS — L300 Nummular dermatitis: Secondary | ICD-10-CM | POA: Diagnosis not present

## 2021-09-27 DIAGNOSIS — L304 Erythema intertrigo: Secondary | ICD-10-CM | POA: Diagnosis not present

## 2021-10-05 DIAGNOSIS — K50919 Crohn's disease, unspecified, with unspecified complications: Secondary | ICD-10-CM | POA: Diagnosis not present

## 2021-11-27 DIAGNOSIS — Z6822 Body mass index (BMI) 22.0-22.9, adult: Secondary | ICD-10-CM | POA: Diagnosis not present

## 2021-11-27 DIAGNOSIS — K603 Anal fistula: Secondary | ICD-10-CM | POA: Diagnosis not present

## 2021-11-27 DIAGNOSIS — L089 Local infection of the skin and subcutaneous tissue, unspecified: Secondary | ICD-10-CM | POA: Diagnosis not present

## 2021-11-27 DIAGNOSIS — S30820A Blister (nonthermal) of lower back and pelvis, initial encounter: Secondary | ICD-10-CM | POA: Diagnosis not present

## 2021-11-27 DIAGNOSIS — K5190 Ulcerative colitis, unspecified, without complications: Secondary | ICD-10-CM | POA: Diagnosis not present

## 2021-11-27 DIAGNOSIS — Z9181 History of falling: Secondary | ICD-10-CM | POA: Diagnosis not present

## 2021-11-30 DIAGNOSIS — K50919 Crohn's disease, unspecified, with unspecified complications: Secondary | ICD-10-CM | POA: Diagnosis not present

## 2022-01-17 DIAGNOSIS — K50113 Crohn's disease of large intestine with fistula: Secondary | ICD-10-CM | POA: Diagnosis not present

## 2022-01-17 DIAGNOSIS — K91850 Pouchitis: Secondary | ICD-10-CM | POA: Diagnosis not present

## 2022-01-18 DIAGNOSIS — I10 Essential (primary) hypertension: Secondary | ICD-10-CM | POA: Diagnosis not present

## 2022-01-18 DIAGNOSIS — I2510 Atherosclerotic heart disease of native coronary artery without angina pectoris: Secondary | ICD-10-CM | POA: Diagnosis not present

## 2022-01-18 DIAGNOSIS — Z7982 Long term (current) use of aspirin: Secondary | ICD-10-CM | POA: Diagnosis not present

## 2022-01-18 DIAGNOSIS — I255 Ischemic cardiomyopathy: Secondary | ICD-10-CM | POA: Diagnosis not present

## 2022-01-18 DIAGNOSIS — E782 Mixed hyperlipidemia: Secondary | ICD-10-CM | POA: Diagnosis not present

## 2022-01-25 DIAGNOSIS — K50919 Crohn's disease, unspecified, with unspecified complications: Secondary | ICD-10-CM | POA: Diagnosis not present

## 2022-01-30 DIAGNOSIS — K50113 Crohn's disease of large intestine with fistula: Secondary | ICD-10-CM | POA: Diagnosis not present

## 2022-01-30 DIAGNOSIS — K91850 Pouchitis: Secondary | ICD-10-CM | POA: Diagnosis not present

## 2022-02-13 ENCOUNTER — Other Ambulatory Visit: Payer: Self-pay

## 2022-02-13 ENCOUNTER — Encounter (HOSPITAL_BASED_OUTPATIENT_CLINIC_OR_DEPARTMENT_OTHER): Payer: Self-pay | Admitting: Surgery

## 2022-02-13 NOTE — Progress Notes (Signed)
Anesthesia Review:  YKZ:LDJT on chart Cardiologist :dr. Laban Emperor McGukin Chest x-ray :none on chart EKG :none on chart Echo :06/20/2020 Stress test:none Cardiac Cath : 12/28/2019 in chart Activity level:  Sleep Study/ CPAP :n/a Fasting Blood Sugar : n/a     / Checks Blood Sugar -- times a day:   Blood Thinner/ Instructions /Last Dose: ASA / Instructions/ Last Dose :  last dose of aspirin 81 mg between 5-7 days before surgery sept 7-9, 2023

## 2022-02-13 NOTE — Progress Notes (Signed)
Spoke w/ via phone for pre-op interview---pt  Lab needs dos---- EKG, I-stat              Lab results------n/a COVID test -----patient states asymptomatic no test needed Arrive at -------0630 NPO after MN NO Solid Food.  Clear liquids from MN until---0530 Med rec completed Medications to take morning of surgery -----metroprolol Diabetic medication -----n/a Patient instructed no nail polish to be worn day of surgery Patient instructed to bring photo id and insurance card day of surgery Patient aware to have Driver (ride ) / caregiver wife Aseel Uhde   for 24 hours after surgery  Patient Special Instructions ----- Pre-Op special Istructions -----spoke with Jocelyn form Dr. Dema Severin office to have cardiac faxed over to [facility] on 02/13/2022 Patient verbalized understanding of instructions that were given at this phone interview. Patient denies shortness of breath, chest pain, fever, cough at this phone interview.

## 2022-02-14 ENCOUNTER — Ambulatory Visit: Payer: Self-pay | Admitting: Surgery

## 2022-02-14 NOTE — Progress Notes (Signed)
Called Dr C. White's office to get cardiac clearance on pt faxed over to [facility] to have on chart, spoke with Elmyra Ricks in Dr. Dema Severin office.

## 2022-02-15 DIAGNOSIS — K50919 Crohn's disease, unspecified, with unspecified complications: Secondary | ICD-10-CM | POA: Diagnosis not present

## 2022-02-15 NOTE — Progress Notes (Signed)
Called Dr. Jeneen Rinks McGukin Cardiologist for the pt office to try and get cardiac clearance for pt sent to [facility].  They couldn't find the clearance.

## 2022-02-15 NOTE — Progress Notes (Signed)
Spoke with Eric Giles at Dr. Orest Dikes office regarding cardiac clearance for pt.   Eric Giles states it has already gone to medical records.   Eric Giles Ronnald Ramp is going to call Dr. Beatrix Fetters office and have is faxed over to dr. Orest Dikes office and then she will fax it to [facility] [phone]

## 2022-02-16 NOTE — Progress Notes (Signed)
Received cardiac clearance from Dr. Orest Dikes office and on chart.   Eric Giles sent it to [facility].

## 2022-02-22 ENCOUNTER — Ambulatory Visit (HOSPITAL_BASED_OUTPATIENT_CLINIC_OR_DEPARTMENT_OTHER)
Admission: RE | Admit: 2022-02-22 | Discharge: 2022-02-22 | Disposition: A | Discharge status: Home / Self Care | Payer: Medicare Other | Attending: Surgery | Admitting: Surgery

## 2022-02-22 ENCOUNTER — Ambulatory Visit (HOSPITAL_BASED_OUTPATIENT_CLINIC_OR_DEPARTMENT_OTHER): Payer: Medicare Other | Admitting: Certified Registered Nurse Anesthetist

## 2022-02-22 ENCOUNTER — Other Ambulatory Visit: Payer: Self-pay

## 2022-02-22 ENCOUNTER — Encounter (HOSPITAL_BASED_OUTPATIENT_CLINIC_OR_DEPARTMENT_OTHER)
Admission: RE | Disposition: A | Discharge status: Home / Self Care | Payer: Self-pay | Source: Home / Self Care | Attending: Surgery

## 2022-02-22 ENCOUNTER — Encounter (HOSPITAL_BASED_OUTPATIENT_CLINIC_OR_DEPARTMENT_OTHER): Payer: Self-pay | Admitting: Surgery

## 2022-02-22 DIAGNOSIS — K50113 Crohn's disease of large intestine with fistula: Secondary | ICD-10-CM | POA: Diagnosis not present

## 2022-02-22 DIAGNOSIS — Z87891 Personal history of nicotine dependence: Secondary | ICD-10-CM

## 2022-02-22 DIAGNOSIS — I10 Essential (primary) hypertension: Secondary | ICD-10-CM | POA: Diagnosis not present

## 2022-02-22 DIAGNOSIS — K603 Anal fistula: Secondary | ICD-10-CM | POA: Insufficient documentation

## 2022-02-22 DIAGNOSIS — I252 Old myocardial infarction: Secondary | ICD-10-CM

## 2022-02-22 DIAGNOSIS — I2510 Atherosclerotic heart disease of native coronary artery without angina pectoris: Secondary | ICD-10-CM | POA: Diagnosis not present

## 2022-02-22 DIAGNOSIS — Z955 Presence of coronary angioplasty implant and graft: Secondary | ICD-10-CM | POA: Diagnosis not present

## 2022-02-22 DIAGNOSIS — K5090 Crohn's disease, unspecified, without complications: Secondary | ICD-10-CM

## 2022-02-22 DIAGNOSIS — K50913 Crohn's disease, unspecified, with fistula: Secondary | ICD-10-CM | POA: Insufficient documentation

## 2022-02-22 DIAGNOSIS — K430 Incisional hernia with obstruction, without gangrene: Secondary | ICD-10-CM

## 2022-02-22 HISTORY — PX: RECTAL EXAM UNDER ANESTHESIA: SHX6399

## 2022-02-22 HISTORY — DX: Unspecified osteoarthritis, unspecified site: M19.90

## 2022-02-22 HISTORY — PX: PLACEMENT OF SETON: SHX6029

## 2022-02-22 LAB — POCT I-STAT, CHEM 8
BUN: 13 mg/dL (ref 8–23)
Calcium, Ion: 1.33 mmol/L (ref 1.15–1.40)
Chloride: 102 mmol/L (ref 98–111)
Creatinine, Ser: 1.8 mg/dL — ABNORMAL HIGH (ref 0.61–1.24)
Glucose, Bld: 123 mg/dL — ABNORMAL HIGH (ref 70–99)
HCT: 38 % — ABNORMAL LOW (ref 39.0–52.0)
Hemoglobin: 12.9 g/dL — ABNORMAL LOW (ref 13.0–17.0)
Potassium: 3.7 mmol/L (ref 3.5–5.1)
Sodium: 139 mmol/L (ref 135–145)
TCO2: 24 mmol/L (ref 22–32)

## 2022-02-22 SURGERY — PLACEMENT, SETON
Anesthesia: Monitor Anesthesia Care | Site: Rectum

## 2022-02-22 MED ORDER — FENTANYL CITRATE (PF) 100 MCG/2ML IJ SOLN: 25.0000 ug | INTRAMUSCULAR | Status: DC | PRN

## 2022-02-22 MED ORDER — ACETAMINOPHEN 500 MG PO TABS
1000.0000 mg | ORAL_TABLET | ORAL | Status: AC
  Administered 2022-02-22: 1000 mg via ORAL

## 2022-02-22 MED ORDER — ONDANSETRON HCL 4 MG/2ML IJ SOLN
INTRAMUSCULAR | Status: DC | PRN
  Administered 2022-02-22: 4 mg via INTRAVENOUS

## 2022-02-22 MED ORDER — PROPOFOL 10 MG/ML IV BOLUS
INTRAVENOUS | Status: AC
  Filled 2022-02-22: qty 20

## 2022-02-22 MED ORDER — SODIUM CHLORIDE 0.9 % IV SOLN: INTRAVENOUS | Status: DC

## 2022-02-22 MED ORDER — ONDANSETRON HCL 4 MG/2ML IJ SOLN: 4.0000 mg | Freq: Once | INTRAMUSCULAR | Status: DC | PRN

## 2022-02-22 MED ORDER — SODIUM CHLORIDE 0.9 % IV SOLN
INTRAVENOUS | Status: AC
  Filled 2022-02-22: qty 100

## 2022-02-22 MED ORDER — METRONIDAZOLE 500 MG/100ML IV SOLN
INTRAVENOUS | Status: AC
  Filled 2022-02-22: qty 100

## 2022-02-22 MED ORDER — MIDAZOLAM HCL 2 MG/2ML IJ SOLN
INTRAMUSCULAR | Status: DC | PRN
  Administered 2022-02-22: 1 mg via INTRAVENOUS

## 2022-02-22 MED ORDER — PROPOFOL 10 MG/ML IV BOLUS
INTRAVENOUS | Status: DC | PRN
  Administered 2022-02-22: 20 mg via INTRAVENOUS

## 2022-02-22 MED ORDER — TRAMADOL HCL 50 MG PO TABS: 50.0000 mg | ORAL_TABLET | Freq: Four times a day (QID) | ORAL | 0 refills | Status: AC | PRN

## 2022-02-22 MED ORDER — FENTANYL CITRATE (PF) 250 MCG/5ML IJ SOLN
INTRAMUSCULAR | Status: DC | PRN
  Administered 2022-02-22: 50 ug via INTRAVENOUS
  Administered 2022-02-22: 25 ug via INTRAVENOUS

## 2022-02-22 MED ORDER — FENTANYL CITRATE (PF) 100 MCG/2ML IJ SOLN
INTRAMUSCULAR | Status: AC
  Filled 2022-02-22: qty 2

## 2022-02-22 MED ORDER — LIDOCAINE 2% (20 MG/ML) 5 ML SYRINGE
INTRAMUSCULAR | Status: DC | PRN
  Administered 2022-02-22: 40 mg via INTRAVENOUS

## 2022-02-22 MED ORDER — PROPOFOL 500 MG/50ML IV EMUL
INTRAVENOUS | Status: DC | PRN
  Administered 2022-02-22: 150 ug/kg/min via INTRAVENOUS

## 2022-02-22 MED ORDER — 0.9 % SODIUM CHLORIDE (POUR BTL) OPTIME
TOPICAL | Status: DC | PRN
  Administered 2022-02-22: 1000 mL

## 2022-02-22 MED ORDER — MIDAZOLAM HCL 2 MG/2ML IJ SOLN
INTRAMUSCULAR | Status: AC
  Filled 2022-02-22: qty 2

## 2022-02-22 MED ORDER — ACETAMINOPHEN 500 MG PO TABS
ORAL_TABLET | ORAL | Status: AC
  Filled 2022-02-22: qty 2

## 2022-02-22 MED ORDER — CHLORHEXIDINE GLUCONATE CLOTH 2 % EX PADS: 6.0000 | MEDICATED_PAD | Freq: Once | CUTANEOUS | Status: DC

## 2022-02-22 MED ORDER — METRONIDAZOLE 500 MG/100ML IV SOLN
500.0000 mg | INTRAVENOUS | Status: AC
  Administered 2022-02-22: 500 mg via INTRAVENOUS

## 2022-02-22 MED ORDER — CEFTRIAXONE SODIUM 2 G IJ SOLR
INTRAMUSCULAR | Status: AC
  Filled 2022-02-22: qty 20

## 2022-02-22 MED ORDER — BUPIVACAINE-EPINEPHRINE 0.25% -1:200000 IJ SOLN
INTRAMUSCULAR | Status: DC | PRN
  Administered 2022-02-22: 40 mL

## 2022-02-22 MED ORDER — SODIUM CHLORIDE 0.9 % IV SOLN
2.0000 g | INTRAVENOUS | Status: AC
  Administered 2022-02-22: 2 g via INTRAVENOUS

## 2022-02-22 MED ORDER — BUPIVACAINE LIPOSOME 1.3 % IJ SUSP: 20.0000 mL | Freq: Once | INTRAMUSCULAR | Status: DC

## 2022-02-22 MED ORDER — EPHEDRINE SULFATE (PRESSORS) 50 MG/ML IJ SOLN
INTRAMUSCULAR | Status: DC | PRN
  Administered 2022-02-22: 5 mg via INTRAVENOUS

## 2022-02-22 MED ORDER — LACTATED RINGERS IV SOLN: INTRAVENOUS | Status: DC

## 2022-02-22 SURGICAL SUPPLY — 59 items
APL SKNCLS STERI-STRIP NONHPOA (GAUZE/BANDAGES/DRESSINGS) ×1
BENZOIN TINCTURE PRP APPL 2/3 (GAUZE/BANDAGES/DRESSINGS) ×1 IMPLANT
BLADE EXTENDED COATED 6.5IN (ELECTRODE) IMPLANT
BLADE SURG 10 STRL SS (BLADE) IMPLANT
BLADE SURG 15 STRL LF DISP TIS (BLADE) ×1 IMPLANT
BLADE SURG 15 STRL SS (BLADE) ×1
BRIEF MESH DISP LRG (UNDERPADS AND DIAPERS) ×1 IMPLANT
COVER BACK TABLE 60X90IN (DRAPES) ×1 IMPLANT
COVER MAYO STAND STRL (DRAPES) ×1 IMPLANT
DECANTER SPIKE VIAL GLASS SM (MISCELLANEOUS) ×1 IMPLANT
DRAPE HYSTEROSCOPY (MISCELLANEOUS) IMPLANT
DRAPE LAPAROTOMY 100X72 PEDS (DRAPES) ×1 IMPLANT
DRAPE SHEET LG 3/4 BI-LAMINATE (DRAPES) IMPLANT
DRAPE UTILITY XL STRL (DRAPES) ×1 IMPLANT
GAUZE 4X4 16PLY ~~LOC~~+RFID DBL (SPONGE) ×1 IMPLANT
GAUZE PAD ABD 8X10 STRL (GAUZE/BANDAGES/DRESSINGS) ×1 IMPLANT
GAUZE SPONGE 4X4 12PLY STRL (GAUZE/BANDAGES/DRESSINGS) IMPLANT
GLOVE BIO SURGEON STRL SZ7.5 (GLOVE) ×1 IMPLANT
GLOVE BIOGEL PI IND STRL 8 (GLOVE) ×1 IMPLANT
GOWN STRL REUS W/TWL LRG LVL3 (GOWN DISPOSABLE) ×1 IMPLANT
HYDROGEN PEROXIDE 16OZ (MISCELLANEOUS) IMPLANT
IV CATH 14GX2 1/4 (CATHETERS) IMPLANT
IV CATH 18G SAFETY (IV SOLUTION) IMPLANT
KIT SIGMOIDOSCOPE (SET/KITS/TRAYS/PACK) IMPLANT
KIT TURNOVER CYSTO (KITS) ×1 IMPLANT
LEGGING LITHOTOMY PAIR STRL (DRAPES) IMPLANT
LOOP VESSEL MAXI BLUE (MISCELLANEOUS) IMPLANT
NDL HYPO 25X1 1.5 SAFETY (NEEDLE) IMPLANT
NDL SAFETY ECLIP 18X1.5 (MISCELLANEOUS) IMPLANT
NEEDLE HYPO 22GX1.5 SAFETY (NEEDLE) ×1 IMPLANT
NEEDLE HYPO 25X1 1.5 SAFETY (NEEDLE) IMPLANT
NS IRRIG 500ML POUR BTL (IV SOLUTION) ×1 IMPLANT
PACK BASIN DAY SURGERY FS (CUSTOM PROCEDURE TRAY) ×1 IMPLANT
PENCIL SMOKE EVACUATOR (MISCELLANEOUS) ×1 IMPLANT
SPONGE HEMORRHOID 8X3CM (HEMOSTASIS) IMPLANT
SPONGE SURGIFOAM ABS GEL 12-7 (HEMOSTASIS) IMPLANT
SUT CHROMIC 2 0 SH (SUTURE) IMPLANT
SUT CHROMIC 3 0 SH 27 (SUTURE) IMPLANT
SUT MNCRL AB 4-0 PS2 18 (SUTURE) IMPLANT
SUT SILK 0 TIES 10X30 (SUTURE) ×1 IMPLANT
SUT SILK 2 0 (SUTURE)
SUT SILK 2 0 SH (SUTURE) ×1 IMPLANT
SUT SILK 2-0 18XBRD TIE 12 (SUTURE) IMPLANT
SUT VIC AB 2-0 SH 27 (SUTURE)
SUT VIC AB 2-0 SH 27XBRD (SUTURE) IMPLANT
SUT VIC AB 3-0 SH 18 (SUTURE) IMPLANT
SUT VIC AB 3-0 SH 27 (SUTURE)
SUT VIC AB 3-0 SH 27X BRD (SUTURE) IMPLANT
SUT VIC AB 3-0 SH 27XBRD (SUTURE) IMPLANT
SUT VIC AB 4-0 P-3 18XBRD (SUTURE) IMPLANT
SUT VIC AB 4-0 P3 18 (SUTURE)
SYR 20ML LL LF (SYRINGE) IMPLANT
SYR BULB IRRIG 60ML STRL (SYRINGE) ×1 IMPLANT
SYR CONTROL 10ML LL (SYRINGE) ×1 IMPLANT
SYR TB 1ML LL NO SAFETY (SYRINGE) IMPLANT
TOWEL OR 17X26 10 PK STRL BLUE (TOWEL DISPOSABLE) ×1 IMPLANT
TRAY DSU PREP LF (CUSTOM PROCEDURE TRAY) ×1 IMPLANT
TUBE CONNECTING 12X1/4 (SUCTIONS) ×1 IMPLANT
YANKAUER SUCT BULB TIP NO VENT (SUCTIONS) ×1 IMPLANT

## 2022-02-22 NOTE — Anesthesia Postprocedure Evaluation (Signed)
Anesthesia Post Note  Patient: Eric Giles  Procedure(s) Performed: INTERROGATION OF ANAL FISTULA WITH PLACEMENT OF DRAINING SETON x2 (Rectum) ANORECTAL EXAM UNDER ANESTHESIA (Rectum)     Patient location during evaluation: PACU Anesthesia Type: MAC Level of consciousness: awake and alert Pain management: pain level controlled Vital Signs Assessment: post-procedure vital signs reviewed and stable Respiratory status: spontaneous breathing, nonlabored ventilation, respiratory function stable and patient connected to nasal cannula oxygen Cardiovascular status: stable, blood pressure returned to baseline and bradycardic Postop Assessment: no apparent nausea or vomiting Anesthetic complications: no   No notable events documented.  Last Vitals:  Vitals:   02/22/22 1056 02/22/22 1100  BP: (!) 146/104 139/69  Pulse: (!) 48 (!) 44  Resp: 18   Temp:    SpO2: 100%     Last Pain:  Vitals:   02/22/22 1056  TempSrc: Oral  PainSc:                  Santa Lighter

## 2022-02-22 NOTE — Progress Notes (Signed)
Dentures and glasses returned to pt.

## 2022-02-22 NOTE — Transfer of Care (Signed)
Immediate Anesthesia Transfer of Care Note  Patient: Eric Giles  Procedure(s) Performed: INTERROGATION OF ANAL FISTULA WITH PLACEMENT OF DRAINING SETON x2 (Rectum) ANORECTAL EXAM UNDER ANESTHESIA (Rectum)  Patient Location: PACU  Anesthesia Type:MAC  Level of Consciousness: awake, alert  and oriented  Airway & Oxygen Therapy: Patient Spontanous Breathing  Post-op Assessment: Report given to RN and Post -op Vital signs reviewed and stable  Post vital signs: Reviewed and stable  Last Vitals:  Vitals Value Taken Time  BP    Temp    Pulse    Resp    SpO2      Last Pain:  Vitals:   02/22/22 0802  TempSrc: Oral  PainSc: 4       Patients Stated Pain Goal: 5 (82/80/03 4917)  Complications: No notable events documented.

## 2022-02-22 NOTE — H&P (Signed)
CC: here today for surgery  HPI: MATTHE SLOANE is an 76 y.o. male with history of IBD, CAD (s/p stents on Brilenta), HTN, HLD who had undergone a two-stage J-pouch operation with Dr. Harlon Ditty at [facility] 25 years ago. He had recently good pouch function following surgery. He does have what was reported to him as a history of bacterial overgrowth in his small intestines that he was often taking suppressive ciprofloxacin for. Beginning in September, 2021 he began having some left-sided perianal pain. He underwent I&D. He has had ongoing drainage. He resides in [place].  He underwent MRI in [place] and was found to have a Tran sphincteric anal fistula with some "dilation" proximal to the anus.  OR 05/18/20 - EUA, pouchoscopy -had placement of draining setons. Findings intraoperatively concerning for Crohn's disease of both his pouch and perianal area.  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 No granulomas. No dysplasia. Anal transition zone biopsy showed chronic mildly active colitis. No granulomas or dysplasia. He is here today for follow-up. He feels overall much better than when he had an abscess. A referral was made to our GI colleagues at [facility] but he would prefer referral to [facility] as he stated his wife did not have good experience here in town. He denies any complaints today.  He has been following with gastroenterology through [facility] - actually repeated a pouchoscopy on him and biopsies. They confirmed our findings of Crohn's disease.   Initiated on infliximab Enrique Sack) with Dr. Drema Dallas at [facility]. He is fine with their clinical satellites in [place], [place].  OR 11/03/20 - EUA, I&D, 2 additional setons. Single internal opening with separate EOs.   He is requesting to be seen by [facility]. He has attempted to obtain appointments multiple GI practices in the [place] area and with [facility] without any success.  INTERVAL HX He has actually been  following with Dr. Drema Dallas and things are going better. He has been on Remicade and has had pretty good response. Overall, he reports his symptoms have substantially improved since starting Remicade. He keeps a gauze pad between the buttocks and has steady drainage but has had no recurrent abscesses. Overall, very satisfied how things are going and remains motivated to not have any sort of ostomy.  PMH: Presumed UC - now s/p rPC with IPAA - Dr. Launa Flight, [facility]; CAD/MI (s/p PCI, on Brilenta); HTN; HLD  PSH: 2 stage J pouch, [facility]  FHx: Denies any known family history of colorectal, breast, endometrial or ovarian cancer.  Social Hx: Denies use of tobacco/EtOH/illicit drug. He is happily retired  Past Medical History:  Diagnosis Date   Arthritis    Chronic back pain    Coronary artery disease    History of kidney stones    Hypertension    Low back pain    chronic   Medical history non-contributory    Myocardial infarction ([place]) 12/2019   STENT x 1   Ulcerative colitis ([place])    taking remicade   Wears glasses    Wears partial dentures    upper    Past Surgical History:  Procedure Laterality Date   APPENDECTOMY     with colon surgery   BACK SURGERY     lower L 3 to L 5, 1990s   COLON SURGERY  late 1990's   CORONARY ANGIOPLASTY     INCISION AND DRAINAGE ABSCESS N/A 05/18/2020   Procedure: POSSIBLE INCISION AND DRAINAGE OF PERIANAL ABSCESS;  Surgeon:  Ileana Roup, MD;  Location: [facility];  Service: General;  Laterality: N/A;   INCISION AND DRAINAGE ABSCESS N/A 11/03/2020   Procedure: INCISION AND DRAINAGE PERIANAL ABSCESS x2;  Surgeon: Ileana Roup, MD;  Location: [place];  Service: General;  Laterality: N/A;   INSERTION OF MESH N/A 12/01/2012   Procedure: INSERTION OF MESH;  Surgeon: Imogene Burn. Georgette Dover, MD;  Location: [facility];  Service: General;  Laterality: N/A;   NOSE SURGERY  yra ago   for deviated septum   PLACEMENT OF SETON N/A 05/18/2020    Procedure: PLACEMENT OF SETON;  Surgeon: Ileana Roup, MD;  Location: [facility];  Service: General;  Laterality: N/A;   PLACEMENT OF SETON N/A 11/03/2020   Procedure: PLACEMENT OF SETON x3;  Surgeon: Ileana Roup, MD;  Location: [place];  Service: General;  Laterality: N/A;   POUCHOSCOPY N/A 05/18/2020   Procedure: DIAGNOSTIC POUCHOSCOPY WITH BIOPSY;  Surgeon: Ileana Roup, MD;  Location: [facility];  Service: General;  Laterality: N/A;   RECTAL EXAM UNDER ANESTHESIA N/A 05/18/2020   Procedure: ANORECTAL EXAM UNDER ANESTHESIA;  Surgeon: Ileana Roup, MD;  Location: [facility];  Service: General;  Laterality: N/A;   RECTAL EXAM UNDER ANESTHESIA N/A 11/03/2020   Procedure: ANORECTAL EXAM UNDER ANESTHESIA;  Surgeon: Ileana Roup, MD;  Location: [place];  Service: General;  Laterality: N/A;   VENTRAL HERNIA REPAIR N/A 12/01/2012   Procedure: LAPAROSCOPIC VENTRAL HERNIA;  Surgeon: Imogene Burn. Georgette Dover, MD;  Location: [facility];  Service: General;  Laterality: N/A;    History reviewed. No pertinent family history.  Social:  reports that he quit smoking about 43 years ago. His smoking use included cigarettes. He has a 1.25 pack-year smoking history. He has never used smokeless tobacco. He reports that he does not drink alcohol and does not use drugs.  Allergies: No Known Allergies  Medications: I have reviewed the patient's current medications.  Results for orders placed or performed during the hospital encounter of 02/22/22 (from the past 48 hour(s))  I-STAT, chem 8     Status: Abnormal   Collection Time: 02/22/22  8:29 AM  Result Value Ref Range   Sodium 139 135 - 145 mmol/L   Potassium 3.7 3.5 - 5.1 mmol/L   Chloride 102 98 - 111 mmol/L   BUN 13 8 - 23 mg/dL   Creatinine, Ser 1.80 (H) 0.61 - 1.24 mg/dL   Glucose, Bld 123 (H) 70 - 99 mg/dL    Comment: Glucose reference range applies only to samples taken after fasting for at least  8 hours.   Calcium, Ion 1.33 1.15 - 1.40 mmol/L   TCO2 24 22 - 32 mmol/L   Hemoglobin 12.9 (L) 13.0 - 17.0 g/dL   HCT 38.0 (L) 39.0 - 52.0 %    No results found.  ROS - all of the below systems have been reviewed with the patient and positives are indicated with bold text General: chills, fever or night sweats Eyes: blurry vision or double vision ENT: epistaxis or sore throat Allergy/Immunology: itchy/watery eyes or nasal congestion Hematologic/Lymphatic: bleeding problems, blood clots or swollen lymph nodes Endocrine: temperature intolerance or unexpected weight changes Breast: new or changing breast lumps or nipple discharge Resp: cough, shortness of breath, or wheezing CV: chest pain or dyspnea on exertion GI: as per HPI GU: dysuria, trouble voiding, or hematuria MSK: joint pain or joint stiffness Neuro: TIA or stroke symptoms Derm: pruritus and  skin lesion changes Psych: anxiety and depression  PE Blood pressure (!) 158/88, pulse 68, temperature 98.7 F (37.1 C), temperature source Oral, resp. rate 16, height '5\' 11"'$  (1.803 m), weight 72.3 kg, SpO2 97 %. Constitutional: NAD; conversant Eyes: Moist conjunctiva; no lid lag Lungs: Normal respiratory effort CV: RRR MSK: Normal range of motion of extremities Psychiatric: Appropriate affect; alert and oriented x3  Results for orders placed or performed during the hospital encounter of 02/22/22 (from the past 48 hour(s))  I-STAT, chem 8     Status: Abnormal   Collection Time: 02/22/22  8:29 AM  Result Value Ref Range   Sodium 139 135 - 145 mmol/L   Potassium 3.7 3.5 - 5.1 mmol/L   Chloride 102 98 - 111 mmol/L   BUN 13 8 - 23 mg/dL   Creatinine, Ser 1.80 (H) 0.61 - 1.24 mg/dL   Glucose, Bld 123 (H) 70 - 99 mg/dL    Comment: Glucose reference range applies only to samples taken after fasting for at least 8 hours.   Calcium, Ion 1.33 1.15 - 1.40 mmol/L   TCO2 24 22 - 32 mmol/L   Hemoglobin 12.9 (L) 13.0 - 17.0 g/dL   HCT  38.0 (L) 39.0 - 52.0 %    No results found.  A/P: GOLDIE DIMMER is an 76 y.o. male with hx of HTN, HLD, CAD (on [place]), presumed UC, ileal J pouch 25 years ago, confirmed Crohn's disease with transsphincteric pouch-anal fistula here for follow-up - indwelling setons  -Now on infliximab, q8 wk infusions, quiescent disease at present  -He now has quiescent disease on infliximab. He has not done well following 1 seton removal with now recurring abscess. We have therefore discussed proceeding with repeat anorectal exam under anesthesia with planned placement of additional seton(s) to control anal-pouch fistulas. -We have reviewed options going forward including further observation vs surgery - noted above -The planned procedures, material risks (including, but not limited to, pain, bleeding, infection, scarring, damage to anal sphincter, incontinence of gas and/or stool, need for additional procedures, recurrence, pneumonia, heart attack, stroke, death) benefits and alternatives to surgery were discussed at length. I noted a good probability that the procedure would help improve his symptoms. The patient's questions were answered to his satisfaction, he voiced understanding and elected to proceed with surgery. Additionally, we discussed typical postoperative expectations and the recovery process.   Nadeen Landau, [facility], [place]

## 2022-02-22 NOTE — Anesthesia Preprocedure Evaluation (Signed)
Anesthesia Evaluation  Patient identified by MRN, date of birth, ID band Patient awake    Reviewed: Allergy & Precautions, NPO status , Patient's Chart, lab work & pertinent test results, reviewed documented beta blocker date and time   Airway Mallampati: II  TM Distance: >3 FB Neck ROM: Full    Dental  (+) Dental Advisory Given, Partial Upper   Pulmonary former smoker,    Pulmonary exam normal breath sounds clear to auscultation       Cardiovascular hypertension, Pt. on home beta blockers + CAD, + Past MI and + Cardiac Stents  Normal cardiovascular exam Rhythm:Regular Rate:Normal     Neuro/Psych negative neurological ROS  negative psych ROS   GI/Hepatic Neg liver ROS, PUD, ANAL FISTULA, CROHN'S DISEASE   Endo/Other  negative endocrine ROS  Renal/GU negative Renal ROS     Musculoskeletal  (+) Arthritis ,   Abdominal   Peds  Hematology negative hematology ROS (+)   Anesthesia Other Findings Day of surgery medications reviewed with the patient.  Reproductive/Obstetrics                             Anesthesia Physical Anesthesia Plan  ASA: 3  Anesthesia Plan: MAC   Post-op Pain Management: Tylenol PO (pre-op)*   Induction: Intravenous  PONV Risk Score and Plan: 1 and TIVA, Treatment may vary due to age or medical condition and Ondansetron  Airway Management Planned: Natural Airway and Simple Face Mask  Additional Equipment:   Intra-op Plan:   Post-operative Plan:   Informed Consent: I have reviewed the patients History and Physical, chart, labs and discussed the procedure including the risks, benefits and alternatives for the proposed anesthesia with the patient or authorized representative who has indicated his/her understanding and acceptance.     Dental advisory given  Plan Discussed with: CRNA  Anesthesia Plan Comments:         Anesthesia Quick Evaluation

## 2022-02-22 NOTE — Op Note (Signed)
02/22/2022  9:59 AM  PATIENT:  Eric Giles  76 y.o. male  Patient Care Team: Patient, No Pcp Per as PCP - General (General Practice)  PRE-OPERATIVE DIAGNOSIS:  Crohn's disease, history of ileal-pouch anal anastomosis  POST-OPERATIVE DIAGNOSIS:  Same + transshpincteric anal fistula - left posterior x2  PROCEDURE:   Interrogation of anal fistula with then placement of draining seton - left posterolateral Interrogation of anal fistula with then placement of draining seton - left posterior Anorectal exam under anesthesia  SURGEON:  Surgeon(s): Ileana Roup, MD   ANESTHESIA:   local and MAC  SPECIMEN:  None  DISPOSITION OF SPECIMEN:  N/A  COUNTS:  Sponge, needle, and instrument counts were reported correct x2 at conclusion.  EBL: 5 mL  PLAN OF CARE: Discharge to home after PACU  PATIENT DISPOSITION:  PACU - hemodynamically stable.  OR FINDINGS: Indwelling seton in the left posterior lateral position which was exchanged for fresh seton.  There is a additional transsphincteric tract emanating from a more distal external opening but sharing a common internal opening in the left posterior/posterior lateral position.  This was controlled with a blue vessel loop seton.  No other perianal findings to suggest perianal abscess fistula in other locations.  Constellation of findings remain clinically consistent with Crohn's disease  DESCRIPTION: The patient was identified in the preoperative holding area and taken to the OR where he was placed on the operating room table. SCDs were placed.  MAC anesthesia was induced without difficulty. The patient was then positioned in high lithotomy with Allen stirrups. Pressure points were then evaluated and padded.  He was then prepped and draped in usual sterile fashion.  A surgical timeout was performed indicating the correct patient, procedure, and positioning.  A perianal block was performed using a dilute mixture of 0.25% Marcaine with  epinephrine and Exparel.   After ascertaining that an appropriate level of anesthesia had been achieved, a well lubricated digital rectal exam was performed. This demonstrated no palpable masses.  A Hill-Ferguson anoscope was into the anal canal and circumferential inspection demonstrated mildly inflamed appearing anoderm.  Intact/patent ileal pouch anal anastomosis.  Internal opening with indwelling seton in the left posterior lateral position.  The seton is quite sustained and discolored.  This tract is controlled with a semirigid fistula probe.  This was then used to exchange his old seton for a new seton.  Externally, more distal to his current transsphincteric fistula there is an external opening with hypergranulation tissue.  This is found to communicate in a transsphincteric nature with the pouch anal anastomosis and shares a common internal opening with his other seton.  The semirigid fistula probe was then used to exchange the probe for a blue vessel loop seton.  Hypergranulation tissue was fulgurated with electrocautery.  The anal canal was inspected and hemostasis is appreciated.  Additional local anesthetic is infiltrated.  All sponge, needle, and instrument counts were reported correct.  A dressing consisting of 4 x 4's, ABD, mesh underwear was placed.  He was taken out of the lithotomy position, awakened from anesthesia, and transferred to a stretcher for transport to PACU in satisfactory condition.  DISPOSITION: PACU in satisfactory condition.

## 2022-02-22 NOTE — Discharge Instructions (Addendum)
ANORECTAL SURGERY: POST OP INSTRUCTIONS  DIET: Follow a light bland diet the first 24 hours after arrival home, such as soup, liquids, crackers, etc.  Be sure to include lots of fluids daily.  Avoid fast food or heavy meals as your are more likely to get nauseated.  Eat a low fat diet the next few days after surgery.   Some bleeding with bowel movements is expected for the first couple of days but this should stop in between bowel movements  Take your usually prescribed home medications unless otherwise directed. No foreign bodies per rectum for the next 3 months (enemas, etc)  PAIN CONTROL: It is helpful to take an over-the-counter pain medication regularly for the first few days/weeks.  Choose from the following that works best for you: Ibuprofen (Advil, etc) Three 200mg tabs every 6 hours as needed. Acetaminophen (Tylenol, etc) 500-650mg every 6 hours as needed NOTE: You may take both of these medications together - most patients find it most helpful when alternating between the two (i.e. Ibuprofen at 6am, tylenol at 9am, ibuprofen at 12pm ...) A  prescription for pain medication may have been prescribed for you at discharge.  Take your pain medication as prescribed.  If you are having problems/concerns with the prescription medicine, please call us for further advice.  Avoid getting constipated.  Between the surgery and the pain medications, it is common to experience some constipation.  Increasing fluid intake (64oz of water per day) and taking a fiber supplement (such as Metamucil, Citrucel, FiberCon) 1-2 times a day regularly will usually help prevent this problem from occurring.  Take Miralax (over the counter) 1-2x/day while taking a narcotic pain medication. If no bowel movement after 48hours, you may additionally take a laxative like a bottle of Milk of Magnesia which can be purchased over the counter. Avoid enemas.   Watch out for diarrhea.  If you have many loose bowel movements,  simplify your diet to bland foods.  Stop any stool softeners and decrease your fiber supplement. If this worsens or does not improve, please call us.  Wash / shower every day.  If you were discharged with a dressing, you may remove this the day after your surgery. You may shower normally, getting soap/water on your wound, particularly after bowel movements.  Soaking in a warm bath filled a couple inches ("Sitz bath") is a great way to clean the area after a bowel movement and many patients find it is a way to soothe the area.  ACTIVITIES as tolerated:   You may resume regular (light) daily activities beginning the next day--such as daily self-care, walking, climbing stairs--gradually increasing activities as tolerated.  If you can walk 30 minutes without difficulty, it is safe to try more intense activity such as jogging, treadmill, bicycling, low-impact aerobics, etc. Refrain from any heavy lifting or straining for the first 2 weeks after your procedure, particularly if your surgery was for hemorrhoids. Avoid activities that make your pain worse You may drive when you are no longer taking prescription pain medication, you can comfortably wear a seatbelt, and you can safely maneuver your car and apply brakes.  FOLLOW UP in our office Please call [facility] at [phone] to set up an appointment to see your surgeon in the office for a follow-up appointment approximately 2 weeks after your surgery. Make sure that you call for this appointment the day you arrive home to insure a convenient appointment time.  9. If you have disability or family leave forms   that need to be completed, you may have them completed by your primary care physician's office; for return to work instructions, please ask our office staff and they will be happy to assist you in obtaining this documentation   When to call us [phone]: Poor pain control Reactions / problems with new medications (rash/itching, etc)  Fever over  101.5 F (38.5 C) Inability to urinate Nausea/vomiting Worsening swelling or bruising Continued bleeding from incision. Increased pain, redness, or drainage from the incision  The clinic staff is available to answer your questions during regular business hours (8:30am-5pm).  Please don't hesitate to call and ask to speak to one of our nurses for clinical concerns.   A surgeon from [facility] is always on call at the hospitals   If you have a medical emergency, go to the nearest emergency room or call 911.   [facility] [address], [address], [place], [place]  [address] MAIN: [phone] FAX: [phone] [email or web address] 

## 2022-02-23 ENCOUNTER — Encounter (HOSPITAL_BASED_OUTPATIENT_CLINIC_OR_DEPARTMENT_OTHER): Payer: Self-pay | Admitting: Surgery

## 2022-03-26 DIAGNOSIS — K50919 Crohn's disease, unspecified, with unspecified complications: Secondary | ICD-10-CM | POA: Diagnosis not present

## 2023-11-21 NOTE — Patient Instructions (Addendum)
 SURGICAL WAITING ROOM VISITATION  Patients having surgery or a procedure may have no more than 2 support people in the waiting area - these visitors may rotate.    Children under the age of 26 must have an adult with them who is not the patient.  Visitors with respiratory illnesses are discouraged from visiting and should remain at home.  If the patient needs to stay at the hospital during part of their recovery, the visitor guidelines for inpatient rooms apply. Pre-op nurse will coordinate an appropriate time for 1 support person to accompany patient in pre-op.  This support person may not rotate.    Please refer to the [facility] website for the visitor guidelines for Inpatients (after your surgery is over and you are in a regular room).    Your procedure is scheduled on: 11/25/23   Report to [facility] Main Entrance    Report to admitting at 5:15 AM   Call this number if you have problems the morning of surgery [phone]   Do not eat food or drink liquids :After Midnight.          If you have questions, please contact your surgeon's office.   FOLLOW BOWEL PREP AND ANY ADDITIONAL PRE OP INSTRUCTIONS YOU RECEIVED FROM YOUR SURGEON'S OFFICE!!!     Oral Hygiene is also important to reduce your risk of infection.                                    Remember - BRUSH YOUR TEETH THE MORNING OF SURGERY WITH YOUR REGULAR TOOTHPASTE  DENTURES WILL BE REMOVED PRIOR TO SURGERY PLEASE DO NOT APPLY Poly grip OR ADHESIVES!!!   Stop all vitamins and herbal supplements 7 days before surgery.   Take these medicines the morning of surgery with A SIP OF WATER: Metoprolol                               You may not have any metal on your body including jewelry, and body piercing             Do not wear lotions, powders, cologne, or deodorant              Men may shave face and neck.   Do not bring valuables to the hospital. [facility] IS NOT             RESPONSIBLE   FOR  VALUABLES.   Contacts, glasses, dentures or bridgework may not be worn into surgery.  DO NOT BRING YOUR HOME MEDICATIONS TO THE HOSPITAL. PHARMACY WILL DISPENSE MEDICATIONS LISTED ON YOUR MEDICATION LIST TO YOU DURING YOUR ADMISSION IN THE HOSPITAL!    Patients discharged on the day of surgery will not be allowed to drive home.  Someone NEEDS to stay with you for the first 24 hours after anesthesia.   Special Instructions: Bring a copy of your healthcare power of attorney and living will documents the day of surgery if you haven't scanned them before.              Please read over the following fact sheets you were given: IF YOU HAVE QUESTIONS ABOUT YOUR PRE-OP INSTRUCTIONS PLEASE CALL [phone] Party   If you received a COVID test during your pre-op visit  it is requested that you wear a mask when out in public, stay  away from anyone that may not be feeling well and notify your surgeon if you develop symptoms. If you test positive for Covid or have been in contact with anyone that has tested positive in the last 10 days please notify you surgeon.    [facility] - Preparing for Surgery Before surgery, you can play an important role.  Because skin is not sterile, your skin needs to be as free of germs as possible.  You can reduce the number of germs on your skin by washing with CHG (chlorahexidine gluconate) soap before surgery.  CHG is an antiseptic cleaner which kills germs and bonds with the skin to continue killing germs even after washing. Please DO NOT use if you have an allergy to CHG or antibacterial soaps.  If your skin becomes reddened/irritated stop using the CHG and inform your nurse when you arrive at Short Stay. Do not shave (including legs and underarms) for at least 48 hours prior to the first CHG shower.  You may shave your face/neck.  Please follow these instructions carefully:  1.  Shower with CHG Soap the night before surgery and the  morning of surgery.  2.  If you  choose to wash your hair, wash your hair first as usual with your normal  shampoo.  3.  After you shampoo, rinse your hair and body thoroughly to remove the shampoo.                             4.  Use CHG as you would any other liquid soap.  You can apply chg directly to the skin and wash.  Gently with a scrungie or clean washcloth.  5.  Apply the CHG Soap to your body ONLY FROM THE NECK DOWN.   Do   not use on face/ open                           Wound or open sores. Avoid contact with eyes, ears mouth and   genitals (private parts).                       Wash face,  Genitals (private parts) with your normal soap.             6.  Wash thoroughly, paying special attention to the area where your    surgery  will be performed.  7.  Thoroughly rinse your body with warm water from the neck down.  8.  DO NOT shower/wash with your normal soap after using and rinsing off the CHG Soap.                9.  Pat yourself dry with a clean towel.            10.  Wear clean pajamas.            11.  Place clean sheets on your bed the night of your first shower and do not  sleep with pets. Day of Surgery : Do not apply any lotions/deodorants the morning of surgery.  Please wear clean clothes to the hospital/surgery center.  FAILURE TO FOLLOW THESE INSTRUCTIONS MAY RESULT IN THE CANCELLATION OF YOUR SURGERY  PATIENT SIGNATURE_________________________________  NURSE SIGNATURE__________________________________  ________________________________________________________________________

## 2023-11-21 NOTE — Progress Notes (Signed)
 Please place orders for PAT appointment scheduled 11/22/23.

## 2023-11-21 NOTE — Progress Notes (Addendum)
 COVID Vaccine Completed:  Date of COVID positive in last 90 days:  PCP - Mechele Spiegel, MD Cardiologist - Elvira Hammersmith, MD LOV 01/18/22 Nephrology- Anastacio Balm, MD  Chest x-ray - 07/15/23 [facility] EKG - 07/15/23 [facility] Stress Test - n/a ECHO - 06/20/20 [facility] Cardiac Cath - 12/28/19 [facility] Pacemaker/ICD device last checked: n/a Spinal Cord Stimulator: n/a  Bowel Prep - no  Sleep Study - n/a CPAP -   Fasting Blood Sugar - n/a Checks Blood Sugar _____ times a day  Last dose of GLP1 agonist-  N/A GLP1 instructions:  Hold 7 days before surgery    Last dose of SGLT-2 inhibitors-  N/A SGLT-2 instructions:  Hold 3 days before surgery    Blood Thinner Instructions:  Last dose:   Time: Aspirin Instructions: ASA 81, do not hold per surgeon Last Dose:  Activity level: Can go up a flight of stairs and perform activities of daily living without stopping and without symptoms of chest pain or shortness of breath.   Anesthesia review: MI stent x1, HTN, CAD  Patient denies shortness of breath, fever, cough and chest pain at PAT appointment  Patient verbalized understanding of instructions that were given to them at the PAT appointment. Patient was also instructed that they will need to review over the PAT instructions again at home before surgery.

## 2023-11-22 ENCOUNTER — Ambulatory Visit: Payer: Self-pay | Admitting: Surgery

## 2023-11-22 ENCOUNTER — Encounter (HOSPITAL_COMMUNITY)
Admission: RE | Admit: 2023-11-22 | Discharge: 2023-11-22 | Disposition: A | Discharge status: Home / Self Care | Source: Ambulatory Visit | Attending: Surgery | Admitting: Surgery

## 2023-11-22 ENCOUNTER — Other Ambulatory Visit: Payer: Self-pay

## 2023-11-22 ENCOUNTER — Encounter (HOSPITAL_COMMUNITY): Payer: Self-pay

## 2023-11-22 VITALS — BP 154/76 | HR 52 | Temp 98.1°F | Resp 16 | Ht 70.0 in | Wt 163.0 lb

## 2023-11-22 DIAGNOSIS — Z01818 Encounter for other preprocedural examination: Secondary | ICD-10-CM

## 2023-11-22 DIAGNOSIS — I10 Essential (primary) hypertension: Secondary | ICD-10-CM | POA: Diagnosis not present

## 2023-11-22 DIAGNOSIS — Z01812 Encounter for preprocedural laboratory examination: Secondary | ICD-10-CM | POA: Insufficient documentation

## 2023-11-22 HISTORY — DX: Gastro-esophageal reflux disease without esophagitis: K21.9

## 2023-11-22 LAB — CBC WITH DIFFERENTIAL/PLATELET
Abs Immature Granulocytes: 0.05 10*3/uL (ref 0.00–0.07)
Basophils Absolute: 0 10*3/uL (ref 0.0–0.1)
Basophils Relative: 0 %
Eosinophils Absolute: 0.8 10*3/uL — ABNORMAL HIGH (ref 0.0–0.5)
Eosinophils Relative: 8 %
HCT: 36.7 % — ABNORMAL LOW (ref 39.0–52.0)
Hemoglobin: 12.1 g/dL — ABNORMAL LOW (ref 13.0–17.0)
Immature Granulocytes: 1 %
Lymphocytes Relative: 16 %
Lymphs Abs: 1.6 10*3/uL (ref 0.7–4.0)
MCH: 31.2 pg (ref 26.0–34.0)
MCHC: 33 g/dL (ref 30.0–36.0)
MCV: 94.6 fL (ref 80.0–100.0)
Monocytes Absolute: 1 10*3/uL (ref 0.1–1.0)
Monocytes Relative: 10 %
Neutro Abs: 6.4 10*3/uL (ref 1.7–7.7)
Neutrophils Relative %: 65 %
Platelets: 274 10*3/uL (ref 150–400)
RBC: 3.88 MIL/uL — ABNORMAL LOW (ref 4.22–5.81)
RDW: 11.9 % (ref 11.5–15.5)
WBC: 9.9 10*3/uL (ref 4.0–10.5)
nRBC: 0 % (ref 0.0–0.2)

## 2023-11-22 LAB — BASIC METABOLIC PANEL WITH GFR
Anion gap: 10 (ref 5–15)
BUN: 16 mg/dL (ref 8–23)
CO2: 23 mmol/L (ref 22–32)
Calcium: 9.5 mg/dL (ref 8.9–10.3)
Chloride: 98 mmol/L (ref 98–111)
Creatinine, Ser: 1.61 mg/dL — ABNORMAL HIGH (ref 0.61–1.24)
GFR, Estimated: 44 mL/min — ABNORMAL LOW (ref >60)
Glucose, Bld: 136 mg/dL — ABNORMAL HIGH (ref 70–99)
Potassium: 4 mmol/L (ref 3.5–5.1)
Sodium: 131 mmol/L — ABNORMAL LOW (ref 135–145)

## 2023-11-22 NOTE — Progress Notes (Incomplete)
 Anesthesia Chart Review   Case: [id number] Date/Time: 11/25/23 0715   Procedures:      ANAL FISTULOTOMY     SIGMOIDOSCOPY, FLEXIBLE     ENDOSCOPY, POUCH, SMALL INTESTINE, DIAGNOSTIC - Pouchoscopy     EXAM UNDER ANESTHESIA, RECTUM   Anesthesia type: Monitor Anesthesia Care   Diagnosis:      Crohn's disease of perianal region, with fistula (HCC) [K50.113, K60.30]     Anal fistula [K60.30]   Pre-op diagnosis: CROHNS DISEASE ANAL FISTULA   [facility] RO[facility] 04 / [facility]   Surgeons: Melvenia Stabs, MD       DISCUSSION:78 y.o. former smoker with h/o HTN, CAD s/p stent 2021, crohns disease, anal fistula scheduled fora bove procedure 11/25/2023 with Dr. Beatris Lincoln.   Pt has a h/o CAD with DES to LAD in 2021.  He has not been seen by cardiology since 2023. Follows with PCP regularly. Reports he can go up a flight of stairs without difficulty or sx.    Discussed with Dr. Yvonnie Heritage, ok to proceed.   VS: BP (!) 154/76   Pulse (!) 52   Temp 36.7 C (Oral)   Resp 16   Ht 5' 10 (1.778 m)   Wt 73.9 kg   SpO2 100%   BMI 23.39 kg/m   PROVIDERS: Patient, No Pcp Per   LABS: {[facility] AN LABS REVIEWED:112001::Labs reviewed: Acceptable for surgery.} (all labs ordered are listed, but only abnormal results are displayed)  Labs Reviewed  CBC WITH DIFFERENTIAL/PLATELET  BASIC METABOLIC PANEL WITH GFR     IMAGES:   EKG:   CV: Echo 06/20/2020 SUMMARY  CPS 12-29-19 EF is better.  The left ventricular size is normal.  There is normal left ventricular wall thickness.  Left ventricular systolic function is borderline reduced.  LV ejection fraction = 50-55%.  There is a small area of apical hypokinesis.  The left atrium is mildly dilated.  There is mild, two-jet mitral regurgitation.  There is mild tricuspid regurgitation.  Estimated right ventricular systolic pressure is 29 mmHg.  CPS 12-29-19 EF is better  Past Medical History:  Diagnosis Date   Arthritis    Chronic back  pain    Coronary artery disease    GERD (gastroesophageal reflux disease)    History of kidney stones    Hypertension    Low back pain    chronic   Medical history non-contributory    Myocardial infarction (HCC) 12/2019   STENT x 1   Ulcerative colitis (HCC)    taking remicade    Wears glasses    Wears partial dentures    upper    Past Surgical History:  Procedure Laterality Date   APPENDECTOMY     with colon surgery   BACK SURGERY     lower L 3 to L 5, 1990s   COLON SURGERY  late 1990's   CORONARY ANGIOPLASTY     INCISION AND DRAINAGE ABSCESS N/A 05/18/2020   Procedure: POSSIBLE INCISION AND DRAINAGE OF PERIANAL ABSCESS;  Surgeon: Melvenia Stabs, MD;  [facility];  Service: General;  Laterality: N/A;   INCISION AND DRAINAGE ABSCESS N/A 11/03/2020   Procedure: INCISION AND DRAINAGE PERIANAL ABSCESS x2;  Surgeon: Melvenia Stabs, MD;  Location: [facility];  Service: General;  Laterality: N/A;   INSERTION OF MESH N/A 12/01/2012   Procedure: INSERTION OF MESH;  Surgeon: Kari Otto. Eli Grizzle, MD;  [facility];  Service: General;  Laterality: N/A;   NOSE SURGERY  yra ago   for deviated septum   PLACEMENT OF SETON N/A 05/18/2020   Procedure: PLACEMENT OF SETON;  Surgeon: Melvenia Stabs, MD;  [facility];  Service: General;  Laterality: N/A;   PLACEMENT OF SETON N/A 11/03/2020   Procedure: PLACEMENT OF SETON x3;  Surgeon: Melvenia Stabs, MD;  Location: [facility] [place];  Service: General;  Laterality: N/A;   PLACEMENT OF SETON N/A 02/22/2022   Procedure: INTERROGATION OF ANAL FISTULA WITH PLACEMENT OF DRAINING SETON x2;  Surgeon: Melvenia Stabs, MD;  Location: [facility];  Service: General;  Laterality: N/A;   POUCHOSCOPY N/A 05/18/2020   Procedure: DIAGNOSTIC POUCHOSCOPY WITH BIOPSY;  Surgeon: Melvenia Stabs, MD;  [facility];  Service: General;  Laterality: N/A;   RECTAL EXAM UNDER  ANESTHESIA N/A 05/18/2020   Procedure: ANORECTAL EXAM UNDER ANESTHESIA;  Surgeon: Melvenia Stabs, MD;  [facility];  Service: General;  Laterality: N/A;   RECTAL EXAM UNDER ANESTHESIA N/A 11/03/2020   Procedure: ANORECTAL EXAM UNDER ANESTHESIA;  Surgeon: Melvenia Stabs, MD;  Location: [facility];  Service: General;  Laterality: N/A;   RECTAL EXAM UNDER ANESTHESIA N/A 02/22/2022   Procedure: ANORECTAL EXAM UNDER ANESTHESIA;  Surgeon: Melvenia Stabs, MD;  Location: [facility];  Service: General;  Laterality: N/A;   VENTRAL HERNIA REPAIR N/A 12/01/2012   Procedure: LAPAROSCOPIC VENTRAL HERNIA;  Surgeon: Kari Otto. Tsuei, MD;  [facility];  Service: General;  Laterality: N/A;    MEDICATIONS:  ascorbic acid (VITAMIN C) 500 MG tablet   ASPIRIN LOW DOSE 81 MG EC tablet   atorvastatin (LIPITOR) 80 MG tablet   Cholecalciferol (VITAMIN D3 ULTRA STRENGTH) 125 MCG (5000 UT) capsule   cyanocobalamin (VITAMIN B12) 1000 MCG tablet   diphenhydrAMINE (BENADRYL) 25 MG tablet   ferrous sulfate 325 (65 FE) MG tablet   FIBER PO   inFLIXimab  (REMICADE  IV)   Magnesium 250 MG CAPS   metoprolol tartrate (LOPRESSOR) 25 MG tablet   Omega-3 Fatty Acids (FISH OIL PO)   valsartan (DIOVAN) 80 MG tablet   No current facility-administered medications for this encounter.

## 2023-11-25 ENCOUNTER — Other Ambulatory Visit: Payer: Self-pay

## 2023-11-25 ENCOUNTER — Encounter (HOSPITAL_COMMUNITY)
Admission: RE | Disposition: A | Discharge status: Home / Self Care | Payer: Self-pay | Source: Home / Self Care | Attending: Surgery

## 2023-11-25 ENCOUNTER — Ambulatory Visit (HOSPITAL_BASED_OUTPATIENT_CLINIC_OR_DEPARTMENT_OTHER): Admitting: Anesthesiology

## 2023-11-25 ENCOUNTER — Ambulatory Visit (HOSPITAL_COMMUNITY)
Admission: RE | Admit: 2023-11-25 | Discharge: 2023-11-25 | Disposition: A | Discharge status: Home / Self Care | Attending: Surgery | Admitting: Surgery

## 2023-11-25 ENCOUNTER — Ambulatory Visit (HOSPITAL_COMMUNITY): Payer: Self-pay | Admitting: Physician Assistant

## 2023-11-25 ENCOUNTER — Encounter (HOSPITAL_COMMUNITY): Payer: Self-pay | Admitting: Surgery

## 2023-11-25 DIAGNOSIS — K611 Rectal abscess: Secondary | ICD-10-CM | POA: Diagnosis not present

## 2023-11-25 DIAGNOSIS — I10 Essential (primary) hypertension: Secondary | ICD-10-CM | POA: Insufficient documentation

## 2023-11-25 DIAGNOSIS — K50113 Crohn's disease of large intestine with fistula: Secondary | ICD-10-CM

## 2023-11-25 DIAGNOSIS — Z87891 Personal history of nicotine dependence: Secondary | ICD-10-CM | POA: Insufficient documentation

## 2023-11-25 DIAGNOSIS — E785 Hyperlipidemia, unspecified: Secondary | ICD-10-CM | POA: Insufficient documentation

## 2023-11-25 DIAGNOSIS — I252 Old myocardial infarction: Secondary | ICD-10-CM

## 2023-11-25 DIAGNOSIS — Z7902 Long term (current) use of antithrombotics/antiplatelets: Secondary | ICD-10-CM | POA: Diagnosis not present

## 2023-11-25 DIAGNOSIS — Z955 Presence of coronary angioplasty implant and graft: Secondary | ICD-10-CM | POA: Insufficient documentation

## 2023-11-25 DIAGNOSIS — I2510 Atherosclerotic heart disease of native coronary artery without angina pectoris: Secondary | ICD-10-CM | POA: Diagnosis not present

## 2023-11-25 SURGERY — ANAL FISTULOTOMY
Anesthesia: Monitor Anesthesia Care

## 2023-11-25 MED ORDER — BUPIVACAINE LIPOSOME 1.3 % IJ SUSP
INTRAMUSCULAR | Status: AC
  Filled 2023-11-25: qty 20

## 2023-11-25 MED ORDER — BUPIVACAINE LIPOSOME 1.3 % IJ SUSP: 20.0000 mL | Freq: Once | INTRAMUSCULAR | Status: DC

## 2023-11-25 MED ORDER — ORAL CARE MOUTH RINSE: 15.0000 mL | Freq: Once | OROMUCOSAL | Status: AC

## 2023-11-25 MED ORDER — PROPOFOL 1000 MG/100ML IV EMUL
INTRAVENOUS | Status: AC
  Filled 2023-11-25: qty 100

## 2023-11-25 MED ORDER — FENTANYL CITRATE (PF) 100 MCG/2ML IJ SOLN: INTRAMUSCULAR | Status: DC | PRN

## 2023-11-25 MED ORDER — CHLORHEXIDINE GLUCONATE 0.12 % MT SOLN
15.0000 mL | Freq: Once | OROMUCOSAL | Status: AC
  Administered 2023-11-25: 15 mL via OROMUCOSAL

## 2023-11-25 MED ORDER — LIDOCAINE HCL (PF) 2 % IJ SOLN: INTRAMUSCULAR | Status: DC | PRN

## 2023-11-25 MED ORDER — SODIUM CHLORIDE 0.9 % IV SOLN
2.0000 g | INTRAVENOUS | Status: AC
  Filled 2023-11-25: qty 20

## 2023-11-25 MED ORDER — BUPIVACAINE-EPINEPHRINE (PF) 0.25% -1:200000 IJ SOLN
INTRAMUSCULAR | Status: AC
  Filled 2023-11-25: qty 30

## 2023-11-25 MED ORDER — METRONIDAZOLE 500 MG/100ML IV SOLN
500.0000 mg | INTRAVENOUS | Status: AC
  Filled 2023-11-25: qty 100

## 2023-11-25 MED ORDER — BUPIVACAINE LIPOSOME 1.3 % IJ SUSP
INTRAMUSCULAR | Status: DC | PRN
  Administered 2023-11-25: 20 mL

## 2023-11-25 MED ORDER — EPHEDRINE SULFATE-NACL 50-0.9 MG/10ML-% IV SOSY
PREFILLED_SYRINGE | INTRAVENOUS | Status: DC | PRN
Start: 2023-11-25 — End: 2023-11-25

## 2023-11-25 MED ORDER — PROPOFOL 500 MG/50ML IV EMUL: INTRAVENOUS | Status: DC | PRN

## 2023-11-25 MED ORDER — DIBUCAINE (PERIANAL) 1 % EX OINT
TOPICAL_OINTMENT | CUTANEOUS | Status: AC
  Filled 2023-11-25: qty 28

## 2023-11-25 MED ORDER — METHYLENE BLUE (ANTIDOTE) 1 % IV SOLN
INTRAVENOUS | Status: AC
  Filled 2023-11-25: qty 10

## 2023-11-25 MED ORDER — BUPIVACAINE-EPINEPHRINE (PF) 0.25% -1:200000 IJ SOLN
INTRAMUSCULAR | Status: DC | PRN
  Administered 2023-11-25: 30 mL

## 2023-11-25 MED ORDER — ONDANSETRON HCL 4 MG/2ML IJ SOLN: INTRAMUSCULAR | Status: DC | PRN

## 2023-11-25 MED ORDER — OXYCODONE HCL 5 MG PO TABS: 5.0000 mg | ORAL_TABLET | Freq: Three times a day (TID) | ORAL | 0 refills | Status: AC | PRN

## 2023-11-25 MED ORDER — FENTANYL CITRATE (PF) 100 MCG/2ML IJ SOLN
INTRAMUSCULAR | Status: AC
  Filled 2023-11-25: qty 2

## 2023-11-25 MED ORDER — EPHEDRINE 5 MG/ML INJ
INTRAVENOUS | Status: AC
  Filled 2023-11-25: qty 5

## 2023-11-25 MED ORDER — ONDANSETRON HCL 4 MG/2ML IJ SOLN
INTRAMUSCULAR | Status: AC
  Filled 2023-11-25: qty 2

## 2023-11-25 MED ORDER — ACETAMINOPHEN 500 MG PO TABS
1000.0000 mg | ORAL_TABLET | ORAL | Status: AC
  Administered 2023-11-25: 1000 mg via ORAL
  Filled 2023-11-25: qty 2

## 2023-11-25 MED ORDER — LACTATED RINGERS IV SOLN
INTRAVENOUS | Status: DC
Start: 2023-11-25 — End: 2023-11-25

## 2023-11-25 MED ORDER — 0.9 % SODIUM CHLORIDE (POUR BTL) OPTIME
TOPICAL | Status: DC | PRN
  Administered 2023-11-25: 1000 mL

## 2023-11-25 MED ORDER — LIDOCAINE HCL (PF) 2 % IJ SOLN
INTRAMUSCULAR | Status: AC
  Filled 2023-11-25: qty 5

## 2023-11-25 SURGICAL SUPPLY — 38 items
BAG COUNTER SPONGE SURGICOUNT (BAG) IMPLANT
BENZOIN TINCTURE PRP APPL 2/3 (GAUZE/BANDAGES/DRESSINGS) IMPLANT
BLADE SURG 15 STRL LF DISP TIS (BLADE) IMPLANT
BRIEF MESH DISP LRG (UNDERPADS AND DIAPERS) ×1 IMPLANT
COVER SURGICAL LIGHT HANDLE (MISCELLANEOUS) ×1 IMPLANT
DISSECTOR SURG LIGASURE 21 (MISCELLANEOUS) IMPLANT
DRAPE LAPAROTOMY T 102X78X121 (DRAPES) ×1 IMPLANT
ELECT NDL TIP 2.8 STRL (NEEDLE) ×1 IMPLANT
ELECT NEEDLE TIP 2.8 STRL (NEEDLE) ×1 IMPLANT
ELECT PENCIL ROCKER SW 15FT (MISCELLANEOUS) IMPLANT
ELECT REM PT RETURN 15FT ADLT (MISCELLANEOUS) ×1 IMPLANT
GAUZE 4X4 16PLY ~~LOC~~+RFID DBL (SPONGE) ×1 IMPLANT
GAUZE PAD ABD 8X10 STRL (GAUZE/BANDAGES/DRESSINGS) IMPLANT
GAUZE SPONGE 4X4 12PLY STRL (GAUZE/BANDAGES/DRESSINGS) IMPLANT
GAUZE SPONGE 4X4 12PLY STRL LF (GAUZE/BANDAGES/DRESSINGS) IMPLANT
GLOVE BIO SURGEON STRL SZ7.5 (GLOVE) ×1 IMPLANT
GLOVE INDICATOR 8.0 STRL GRN (GLOVE) ×1 IMPLANT
GOWN STRL REUS W/ TWL XL LVL3 (GOWN DISPOSABLE) ×1 IMPLANT
KIT BASIN OR (CUSTOM PROCEDURE TRAY) ×1 IMPLANT
KIT TURNOVER KIT A (KITS) ×2 IMPLANT
LOOP VESSEL MAXI BLUE (MISCELLANEOUS) IMPLANT
NDL HYPO 22X1.5 SAFETY MO (MISCELLANEOUS) ×1 IMPLANT
NEEDLE HYPO 22X1.5 SAFETY MO (MISCELLANEOUS) ×1 IMPLANT
PACK BASIC VI WITH GOWN DISP (CUSTOM PROCEDURE TRAY) ×1 IMPLANT
PAK SCROTO (SET/KITS/TRAYS/PACK) IMPLANT
RETRACTOR RING URO 16.6X16.6 (MISCELLANEOUS) IMPLANT
RETRACTOR STAY HOOK 5MM (MISCELLANEOUS) IMPLANT
SHEARS HARMONIC 9CM CVD (BLADE) IMPLANT
SPIKE FLUID TRANSFER (MISCELLANEOUS) ×1 IMPLANT
SURGILUBE 2OZ TUBE FLIPTOP (MISCELLANEOUS) ×1 IMPLANT
SUT CHROMIC 2 0 SH (SUTURE) IMPLANT
SUT CHROMIC 3 0 SH 27 (SUTURE) IMPLANT
SUT VIC AB 2-0 SH 27X BRD (SUTURE) IMPLANT
SUT VIC AB 2-0 UR6 27 (SUTURE) ×2 IMPLANT
SYR 20ML LL LF (SYRINGE) ×1 IMPLANT
SYRINGE TOOMEY IRRIG 70ML (MISCELLANEOUS) IMPLANT
TOWEL OR 17X26 10 PK STRL BLUE (TOWEL DISPOSABLE) ×1 IMPLANT
TUBING CONNECTING 10 (TUBING) IMPLANT

## 2023-11-25 NOTE — Anesthesia Preprocedure Evaluation (Addendum)
 Anesthesia Evaluation  Patient identified by MRN, date of birth, ID band Patient awake    Reviewed: Allergy & Precautions, NPO status , Patient's Chart, lab work & pertinent test results, reviewed documented beta blocker date and time   Airway Mallampati: I  TM Distance: >3 FB Neck ROM: Full    Dental  (+) Partial Upper, Dental Advisory Given   Pulmonary former smoker   Pulmonary exam normal breath sounds clear to auscultation       Cardiovascular hypertension, Pt. on home beta blockers and Pt. on medications + CAD, + Past MI and + Cardiac Stents  Normal cardiovascular exam Rhythm:Regular Rate:Normal     Neuro/Psych negative neurological ROS  negative psych ROS   GI/Hepatic Neg liver ROS, PUD,GERD  ,,UC   Endo/Other  negative endocrine ROS    Renal/GU negative Renal ROS  negative genitourinary   Musculoskeletal  (+) Arthritis ,    Abdominal   Peds  Hematology negative hematology ROS (+)   Anesthesia Other Findings   Reproductive/Obstetrics                             Anesthesia Physical Anesthesia Plan  ASA: 3  Anesthesia Plan: MAC   Post-op Pain Management: Tylenol  PO (pre-op)*   Induction: Intravenous  PONV Risk Score and Plan: 1 and Propofol  infusion, Treatment may vary due to age or medical condition, Ondansetron  and Dexamethasone   Airway Management Planned: Natural Airway  Additional Equipment:   Intra-op Plan:   Post-operative Plan:   Informed Consent: I have reviewed the patients History and Physical, chart, labs and discussed the procedure including the risks, benefits and alternatives for the proposed anesthesia with the patient or authorized representative who has indicated his/her understanding and acceptance.     Dental advisory given  Plan Discussed with: CRNA  Anesthesia Plan Comments:        Anesthesia Quick Evaluation

## 2023-11-25 NOTE — Op Note (Addendum)
 11/25/2023  8:38 AM  PATIENT:  Eric Giles  78 y.o. male  Patient Care Team: Patient, No Pcp Per as PCP - General (General Practice)  PRE-OPERATIVE DIAGNOSIS:  Perianal Crohn's disease; history of ileal J pouch  POST-OPERATIVE DIAGNOSIS:  Same  PROCEDURE:   Surgical treatment of transsphincteric anal fistula with placement of draining seton Diagnostic flexible sigmoidoscopy/pouchoscopy Incision and drainage of perirectal abscess  SURGEON:  Surgeon(s): Melvenia Stabs, MD    ANESTHESIA:   local and MAC  SPECIMEN:  No Specimen  DISPOSITION OF SPECIMEN:  N/A  COUNTS:  Sponge, needle, and instrument counts were reported correct x2 at conclusion.  EBL: 2 mL  PLAN OF CARE: Discharge to home after PACU  PATIENT DISPOSITION:  PACU - hemodynamically stable.  OR FINDINGS: Flexible pouchoscopy demonstrates granular inflammation within the anal canal as expected but without any evident ulcers or masses.  J-pouch appears healthy and normal.  We did evaluate the candycane limb of the J-pouch which is normal.  We also evaluated all the way up to about 40 cm the neoterminal ileum which demonstrates healthy normal-appearing small bowel mucosa.  There is no evidence of pouchitis.    2 pre-existing blue vessel loop setons in place, exchanged for fresh setons.  An additional third seton was placed controlling a third transsphincteric anal fistula.  At this third location, there is a chronic abscess cavity which was opened and drained.  DESCRIPTION: The patient was identified in the preoperative holding area and taken to the OR where he was placed on the operating room table. SCDs were placed.  MAC anesthesia was induced without difficulty. The patient was then positioned in high lithotomy with Allen stirrups. Pressure points were then evaluated and padded.  He was then prepped and draped in usual sterile fashion.  A surgical timeout was performed indicating the correct patient, procedure,  and positioning.  A perianal block was performed using a dilute mixture of 0.25% Marcaine  with epinephrine  and Exparel .   After ascertaining that an appropriate level of anesthesia had been achieved, a well lubricated digital rectal exam was performed. This demonstrated patent anal canal with chronic scar tissue within the anal canal.  This opens into a soft J-pouch.  A Hill-Ferguson anoscope was into the anal canal and circumferential inspection demonstrated anoderm with particular in the left side granular tissue consistent with his known anal fistulas.  There is no masses or ulcerations.  Remainder of the anal canal is healthy appearing.  He does have 2 pre-existing blue vessel loop setons controlling left-sided transsphincteric fistulas.  Some of the silk sutures have been disrupted and are no longer in place.  We therefore opted to ultimately replace the setons.  A flexible sigmoidoscope was brought onto the field.  Flexible pouchoscopy is then utilized to evaluate his anal canal and J-pouch.  This is inserted into the anal canal and a direct visualization.  The J-pouch itself is completely normal and healthy in appearance.  There is no evident small bowel inflammation.  The candycane limb of the J-pouch is intubated and normal.  The sigmoidoscope is then advanced into the afferent limb of the J-pouch extending up to approximately 40 cm.  The neoterminal ileum at this location is normal in appearance.  There is no evidence of pouchitis or any other complicating factors of his J-pouch.  The flexible sigmoidoscope was removed after evacuating air.  Attention is then directed at the pre-existing transsphincteric anal fistulas.  The 2 pre-existing blue vessel loop setons  were removed and exchanged for fresh setons.  These were each secured with 0 silk ties.  Externally, the left ischioanal & ischiorectal fossa, there is an evident abscess cavity that communicates with the left posterior transsphincteric anal  fistula.  This is transsphincteric in nature and courses more externally.  An incision is created over the area of maximal fluctuance.  A small amount of pus is evacuated.  This cavity communicates with a transsphincteric anal fistula and is controlled using a blue vessel loop seton.  He now has 3 vessel loop setons controlling his transsphincteric fistulas.  Additional local anesthetic is infiltrated.  The area is washed and dried.  Hemostasis is verified.  All sponge, needle, instrument counts are reported correct.  He is ultimately taken out of the lithotomy position, and a dressing consisting of 4 x 4's and mesh underwear was placed.  He is awakened from anesthesia and transferred to a stretcher for transport to recovery in satisfactory condition.  DISPOSITION: PACU in satisfactory condition.

## 2023-11-25 NOTE — Anesthesia Procedure Notes (Signed)
 Procedure Name: MAC Date/Time: 11/25/2023 7:40 AM  Performed by: Elaina Graver, CRNAPre-anesthesia Checklist: Patient identified, Emergency Drugs available, Suction available, Patient being monitored and Timeout performed Patient Re-evaluated:Patient Re-evaluated prior to induction Oxygen Delivery Method: Simple face mask Preoxygenation: Pre-oxygenation with 100% oxygen Induction Type: IV induction Placement Confirmation: positive ETCO2 Dental Injury: Teeth and Oropharynx as per pre-operative assessment

## 2023-11-25 NOTE — Transfer of Care (Signed)
 Immediate Anesthesia Transfer of Care Note  Patient: Eric Giles  Procedure(s) Performed: ANAL FISTULOTOMY SIGMOIDOSCOPY, FLEXIBLE ENDOSCOPY, POUCH, SMALL INTESTINE, DIAGNOSTIC EXAM UNDER ANESTHESIA, RECTUM  Patient Location: PACU  Anesthesia Type:MAC  Level of Consciousness: awake, alert , and oriented  Airway & Oxygen Therapy: Patient Spontanous Breathing  Post-op Assessment: Report given to RN and Post -op Vital signs reviewed and stable  Post vital signs: Reviewed and stable  Last Vitals:  Vitals Value Taken Time  BP 114/66 11/25/23 08:46  Temp    Pulse 56 11/25/23 08:48  Resp 14 11/25/23 08:48  SpO2 97 % 11/25/23 08:48  Vitals shown include unfiled device data.  Last Pain:  Vitals:   11/25/23 0659  TempSrc:   PainSc: 0-No pain         Complications: No notable events documented.

## 2023-11-25 NOTE — Anesthesia Postprocedure Evaluation (Signed)
 Anesthesia Post Note  Patient: Eric Giles  Procedure(s) Performed: ANAL FISTULOTOMY SIGMOIDOSCOPY, FLEXIBLE ENDOSCOPY, POUCH, SMALL INTESTINE, DIAGNOSTIC EXAM UNDER ANESTHESIA, RECTUM     Patient location during evaluation: PACU Anesthesia Type: MAC Level of consciousness: awake and alert Pain management: pain level controlled Vital Signs Assessment: post-procedure vital signs reviewed and stable Respiratory status: spontaneous breathing, nonlabored ventilation, respiratory function stable and patient connected to nasal cannula oxygen Cardiovascular status: stable and blood pressure returned to baseline Postop Assessment: no apparent nausea or vomiting Anesthetic complications: no  No notable events documented.  Last Vitals:  Vitals:   11/25/23 0915 11/25/23 1001  BP: 133/72 (!) (P) 157/77  Pulse: (!) 46 (!) (P) 50  Resp: 13   Temp:  (P) 36.4 C  SpO2: 99% (P) 100%    Last Pain:  Vitals:   11/25/23 1001  TempSrc: (P) Oral  PainSc: 0-No pain                 Geovanie Winnett L Moataz Tavis

## 2023-11-25 NOTE — H&P (Signed)
 CC: Here today for surgery  HPI: Eric Giles is an 78 y.o. male with history of IBD, CAD (s/p stents on Brilenta), HTN, HLD who had undergone a two-stage J-pouch operation with Dr. Leanor Proper at [facility] 25 years ago. He had recently good pouch function following surgery. He does have what was reported to him as a history of bacterial overgrowth in his small intestines that he was often taking suppressive ciprofloxacin  for. Beginning in September, 2021 he began having some left-sided perianal pain. He underwent I&D. He has had ongoing drainage. He resides in [place].  He underwent MRI in [place] and was found to have a Tran sphincteric anal fistula with some dilation proximal to the anus.  OR 05/18/20 - EUA, pouchoscopy -had placement of draining setons. Findings intraoperatively concerning for Crohn's disease of both his pouch and perianal area.  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 No granulomas. No dysplasia. Anal transition zone biopsy showed chronic mildly active colitis. No granulomas or dysplasia. He is here today for follow-up. He feels overall much better than when he had an abscess. A referral was made to our GI colleagues at [facility] but he would prefer referral to [facility] as he stated his wife did not have good experience here in town. He denies any complaints today.  He has been following with gastroenterology through [facility] - actually repeated a pouchoscopy on him and biopsies. They confirmed our findings of Crohn's disease.   Initiated on infliximab  (Avsola ) with Dr. Langston Pippins at [facility]. He is fine with their clinical satellites in [place], [place].  OR 11/03/20 - EUA, I&D, 2 additional setons. Single internal opening with separate EOs.   He is requesting to be seen by [facility]. He has attempted to obtain appointments multiple GI practices in the [place] area and with [facility] without any success.  He has actually been following with  Dr. Langston Pippins and things are going better. He has been on Remicade  and has had pretty good response. Overall, he reports his symptoms have substantially improved since starting Remicade . He keeps a gauze pad between the buttocks and has steady drainage but has had no recurrent abscesses. Overall, very satisfied how things are going and remains motivated to not have any sort of ostomy.  OR 02/22/22  Interrogation of anal fistula with then placement of draining seton - left posterolateral Interrogation of anal fistula with then placement of draining seton - left posterior Anorectal exam under anesthesia  OR FINDINGS: Indwelling seton in the left posterior lateral position which was exchanged for fresh seton. There is an additional transsphincteric tract emanating from a more distal external opening but sharing a common internal opening in the left posterior/posterior lateral position. This was controlled with a blue vessel loop seton. No other perianal findings to suggest perianal abscess fistula in other locations. Constellation of findings remain clinically consistent with Crohn's disease  Postop developed chills, low grade temps over weekend. No perianal pain, purulent drainage, urinary retention, etc. he otherwise feels completely fine.   INTERVAL HX Taking infliximab  as prescribed with GI Dr. Mariah Shines pouch function. No urgency or increase in frequency. No blood in his stool. Quite satisfied. Has discussed with his wife seton removal versus ongoing observation with them in place and he is elected to keep these in place for now. Would also not like to attempt any sort of fistula surgery in particular given his pouch history.  Seen in urgent office 6/5 & 6/6 -  perirectal abscess-photo demonstrates this to be in the left lateral position. This is just inferior to a pre-existing seton. He has done well since. Scant oozing but no other issues. 4 days left on his antibiotics that was prescribed. Denies  any complaints at present.  He denies any changes in health or health history since we met in the office. No new medications/allergies. He states he is ready for surgery today.  PMH: Presumed UC - now s/p rPC with IPAA - Dr. Aloha Arnold, [facility]; CAD/MI (s/p PCI, on Brilenta); HTN; HLD  PSH: 2 stage J pouch, [facility]  FHx: Denies any known family history of colorectal, breast, endometrial or ovarian cancer.  Social Hx: Denies use of tobacco/EtOH/illicit drug. He is happily retired. He is here today with his wife of 44 years   Past Medical History:  Diagnosis Date   Arthritis    Chronic back pain    Coronary artery disease    GERD (gastroesophageal reflux disease)    History of kidney stones    Hypertension    Low back pain    chronic   Medical history non-contributory    Myocardial infarction (HCC) 12/2019   STENT x 1   Ulcerative colitis (HCC)    taking remicade    Wears glasses    Wears partial dentures    upper    Past Surgical History:  Procedure Laterality Date   APPENDECTOMY     with colon surgery   BACK SURGERY     lower L 3 to L 5, 1990s   COLON SURGERY  late 1990's   CORONARY ANGIOPLASTY     INCISION AND DRAINAGE ABSCESS N/A 05/18/2020   Procedure: POSSIBLE INCISION AND DRAINAGE OF PERIANAL ABSCESS;  Surgeon: Melvenia Stabs, MD;  Location: [facility];  Service: General;  Laterality: N/A;   INCISION AND DRAINAGE ABSCESS N/A 11/03/2020   Procedure: INCISION AND DRAINAGE PERIANAL ABSCESS x2;  Surgeon: Melvenia Stabs, MD;  Location: [facility];  Service: General;  Laterality: N/A;   INSERTION OF MESH N/A 12/01/2012   Procedure: INSERTION OF MESH;  Surgeon: Kari Otto. Eli Grizzle, MD;  Location: [facility];  Service: General;  Laterality: N/A;   NOSE SURGERY  yra ago   for deviated septum   PLACEMENT OF SETON N/A 05/18/2020   Procedure: PLACEMENT OF SETON;  Surgeon: Melvenia Stabs, MD;  Location: [facility];  Service: General;  Laterality: N/A;   PLACEMENT OF  SETON N/A 11/03/2020   Procedure: PLACEMENT OF SETON x3;  Surgeon: Melvenia Stabs, MD;  Location: [facility];  Service: General;  Laterality: N/A;   PLACEMENT OF SETON N/A 02/22/2022   Procedure: INTERROGATION OF ANAL FISTULA WITH PLACEMENT OF DRAINING SETON x2;  Surgeon: Melvenia Stabs, MD;  Location: [facility];  Service: General;  Laterality: N/A;   POUCHOSCOPY N/A 05/18/2020   Procedure: DIAGNOSTIC POUCHOSCOPY WITH BIOPSY;  Surgeon: Melvenia Stabs, MD;  Location: [facility];  Service: General;  Laterality: N/A;   RECTAL EXAM UNDER ANESTHESIA N/A 05/18/2020   Procedure: ANORECTAL EXAM UNDER ANESTHESIA;  Surgeon: Melvenia Stabs, MD;  Location: [facility];  Service: General;  Laterality: N/A;   RECTAL EXAM UNDER ANESTHESIA N/A 11/03/2020   Procedure: ANORECTAL EXAM UNDER ANESTHESIA;  Surgeon: Melvenia Stabs, MD;  Location: [facility];  Service: General;  Laterality: N/A;   RECTAL EXAM UNDER ANESTHESIA N/A 02/22/2022   Procedure: ANORECTAL EXAM UNDER ANESTHESIA;  Surgeon: Melvenia Stabs, MD;  Location: [facility];  Service: General;  Laterality: N/A;   VENTRAL HERNIA REPAIR N/A 12/01/2012   Procedure: LAPAROSCOPIC VENTRAL HERNIA;  Surgeon: Kari Otto. Eli Grizzle, MD;  Location: [facility];  Service: General;  Laterality: N/A;    History reviewed. No pertinent family history.  Social:  reports that he quit smoking about 45 years ago. His smoking use included cigarettes. He started smoking about 50 years ago. He has a 1.3 pack-year smoking history. He has never used smokeless tobacco. He reports that he does not drink alcohol and does not use drugs.  Allergies: No Known Allergies  Medications: I have reviewed the patient's current medications.  No results found for this or any previous visit (from the past 48 hours).  No results found.   PE Blood pressure (!) 150/86, pulse (!) 51, temperature 98.3 F (36.8  C), temperature source Oral, resp. rate 18, height 5' 10 (1.778 m), weight 73.9 kg, SpO2 96%. Constitutional: NAD; conversant Eyes: Moist conjunctiva; no lid lag; anicteric Lungs: Normal respiratory effort CV: RRR Psychiatric: Appropriate affect  No results found for this or any previous visit (from the past 48 hours).  No results found.  A/P: Eric Giles is an 78 y.o. male with hx of HTN, HLD, CAD (on [place]), presumed UC, ileal J pouch 25 years ago, confirmed Crohn's disease with transsphincteric pouch-anal fistula here for follow-up - indwelling setons  -Now on infliximab , q8 wk infusions, quiescent disease at present albeit abscess 11/14/23  -Had new seton placed and additional seton placed 02/22/2022.   -New perirectal abscess 11/14/23. May be through the same tract where he previously had a seton however. This would argue for more of a persistent/stable disease picture as opposed to progressive or failing treatment at present.  - Discussed additional seton placement vs fecal diversion/possible pouch excision. He is having good pouch function and quite happy with how things are. He still feels QoL is better with pouch and setons than ostomy.  -The anatomy and physiology of the anal canal was discussed with the patient with associated pictures. The pathophysiology of Crohn's disease in the setting of ileal pouch was discussed at length as well, which he understands well at present -We have reviewed options going forward including further observation vs surgery -surgical treatment of transsphincteric anal fistula with placement of draining seton; flexible sigmoidoscopy/proctoscopy; anorectal exam under anesthesia. -The planned procedure, material risks (including, but not limited to, pain, bleeding, infection, scarring, need for blood transfusion, damage to anal sphincter, incontinence of gas and/or stool, need for additional procedures, anal stenosis, rare cases of pelvic sepsis which  in severe cases may require things like a colostomy, recurrence, pneumonia, heart attack, stroke, death) benefits and alternatives to surgery were discussed at length. I noted a good probability that the procedure would help improve their symptoms. The patient's questions were answered to he and his wife's satisfaction, they voiced understanding and elected to proceed with surgery. Additionally, we discussed typical postoperative expectations and the recovery process.   Beatris Lincoln, MD [facility], [facility]

## 2023-11-25 NOTE — Discharge Instructions (Addendum)

## 2023-11-26 ENCOUNTER — Encounter (HOSPITAL_COMMUNITY): Payer: Self-pay | Admitting: Surgery
